# Patient Record
Sex: Female | Born: 1945 | Race: White | Hispanic: No | Smoking: Former smoker
Health system: Southern US, Community
[De-identification: ages and names within clinical notes are randomized; demographics above are authoritative.]

## PROBLEM LIST (undated history)

## (undated) DIAGNOSIS — D179 Benign lipomatous neoplasm, unspecified: Secondary | ICD-10-CM

## (undated) DIAGNOSIS — E669 Obesity, unspecified: Secondary | ICD-10-CM

## (undated) DIAGNOSIS — M25561 Pain in right knee: Secondary | ICD-10-CM

## (undated) DIAGNOSIS — K5792 Diverticulitis of intestine, part unspecified, without perforation or abscess without bleeding: Secondary | ICD-10-CM

## (undated) DIAGNOSIS — I10 Essential (primary) hypertension: Secondary | ICD-10-CM

## (undated) DIAGNOSIS — M25562 Pain in left knee: Secondary | ICD-10-CM

## (undated) DIAGNOSIS — C541 Malignant neoplasm of endometrium: Secondary | ICD-10-CM

## (undated) HISTORY — PX: CATARACT EXTRACTION: SUR2

## (undated) HISTORY — DX: Pain in right knee: M25.561

## (undated) HISTORY — DX: Obesity, unspecified: E66.9

## (undated) HISTORY — PX: TOTAL ABDOMINAL HYSTERECTOMY: SHX209

## (undated) HISTORY — DX: Essential (primary) hypertension: I10

## (undated) HISTORY — DX: Malignant neoplasm of endometrium: C54.1

## (undated) HISTORY — DX: Benign lipomatous neoplasm, unspecified: D17.9

## (undated) HISTORY — DX: Pain in left knee: M25.562

---

## 1998-06-13 ENCOUNTER — Emergency Department (HOSPITAL_COMMUNITY): Admission: EM | Admit: 1998-06-13 | Discharge: 1998-06-13 | Payer: Self-pay | Admitting: Emergency Medicine

## 1998-06-18 ENCOUNTER — Ambulatory Visit (HOSPITAL_COMMUNITY): Admission: RE | Admit: 1998-06-18 | Discharge: 1998-06-18 | Payer: Self-pay | Admitting: Cardiology

## 2003-12-04 ENCOUNTER — Emergency Department (HOSPITAL_COMMUNITY): Admission: EM | Admit: 2003-12-04 | Discharge: 2003-12-04 | Payer: Self-pay | Admitting: Emergency Medicine

## 2006-04-30 ENCOUNTER — Ambulatory Visit: Payer: Self-pay | Admitting: Family Medicine

## 2006-12-21 ENCOUNTER — Ambulatory Visit: Payer: Self-pay | Admitting: Family Medicine

## 2008-02-18 ENCOUNTER — Ambulatory Visit: Payer: Self-pay | Admitting: Family Medicine

## 2008-02-19 ENCOUNTER — Ambulatory Visit: Payer: Self-pay | Admitting: Family Medicine

## 2008-12-29 ENCOUNTER — Ambulatory Visit: Payer: Self-pay | Admitting: Family Medicine

## 2010-01-17 ENCOUNTER — Ambulatory Visit: Payer: Self-pay | Admitting: Family Medicine

## 2011-12-05 DIAGNOSIS — I10 Essential (primary) hypertension: Secondary | ICD-10-CM | POA: Diagnosis not present

## 2011-12-11 DIAGNOSIS — Z23 Encounter for immunization: Secondary | ICD-10-CM | POA: Diagnosis not present

## 2012-10-27 DIAGNOSIS — Z23 Encounter for immunization: Secondary | ICD-10-CM | POA: Diagnosis not present

## 2012-11-27 HISTORY — PX: CHOLECYSTECTOMY: SHX55

## 2012-11-29 DIAGNOSIS — I10 Essential (primary) hypertension: Secondary | ICD-10-CM | POA: Diagnosis not present

## 2012-11-29 DIAGNOSIS — F411 Generalized anxiety disorder: Secondary | ICD-10-CM | POA: Diagnosis not present

## 2012-11-29 DIAGNOSIS — Z1331 Encounter for screening for depression: Secondary | ICD-10-CM | POA: Diagnosis not present

## 2013-05-27 DIAGNOSIS — K8010 Calculus of gallbladder with chronic cholecystitis without obstruction: Secondary | ICD-10-CM | POA: Diagnosis not present

## 2013-05-27 DIAGNOSIS — R1011 Right upper quadrant pain: Secondary | ICD-10-CM | POA: Diagnosis not present

## 2013-05-27 DIAGNOSIS — Z79899 Other long term (current) drug therapy: Secondary | ICD-10-CM | POA: Diagnosis not present

## 2013-05-27 DIAGNOSIS — R112 Nausea with vomiting, unspecified: Secondary | ICD-10-CM | POA: Diagnosis not present

## 2013-05-27 DIAGNOSIS — K828 Other specified diseases of gallbladder: Secondary | ICD-10-CM | POA: Diagnosis not present

## 2013-05-27 DIAGNOSIS — K8000 Calculus of gallbladder with acute cholecystitis without obstruction: Secondary | ICD-10-CM | POA: Diagnosis not present

## 2013-05-27 DIAGNOSIS — E669 Obesity, unspecified: Secondary | ICD-10-CM | POA: Diagnosis not present

## 2013-05-27 DIAGNOSIS — I10 Essential (primary) hypertension: Secondary | ICD-10-CM | POA: Diagnosis not present

## 2013-05-27 DIAGNOSIS — R109 Unspecified abdominal pain: Secondary | ICD-10-CM | POA: Diagnosis not present

## 2013-05-27 DIAGNOSIS — K8020 Calculus of gallbladder without cholecystitis without obstruction: Secondary | ICD-10-CM | POA: Diagnosis not present

## 2013-08-12 DIAGNOSIS — F411 Generalized anxiety disorder: Secondary | ICD-10-CM | POA: Diagnosis not present

## 2013-08-12 DIAGNOSIS — Z23 Encounter for immunization: Secondary | ICD-10-CM | POA: Diagnosis not present

## 2013-08-12 DIAGNOSIS — I10 Essential (primary) hypertension: Secondary | ICD-10-CM | POA: Diagnosis not present

## 2013-08-12 DIAGNOSIS — Z1211 Encounter for screening for malignant neoplasm of colon: Secondary | ICD-10-CM | POA: Diagnosis not present

## 2014-01-16 DIAGNOSIS — Z1211 Encounter for screening for malignant neoplasm of colon: Secondary | ICD-10-CM | POA: Diagnosis not present

## 2014-05-19 DIAGNOSIS — H2589 Other age-related cataract: Secondary | ICD-10-CM | POA: Diagnosis not present

## 2014-06-15 DIAGNOSIS — H269 Unspecified cataract: Secondary | ICD-10-CM | POA: Diagnosis not present

## 2014-06-15 DIAGNOSIS — H2510 Age-related nuclear cataract, unspecified eye: Secondary | ICD-10-CM | POA: Diagnosis not present

## 2014-06-15 DIAGNOSIS — H2589 Other age-related cataract: Secondary | ICD-10-CM | POA: Diagnosis not present

## 2014-07-07 ENCOUNTER — Other Ambulatory Visit (HOSPITAL_COMMUNITY)
Admission: RE | Admit: 2014-07-07 | Discharge: 2014-07-07 | Disposition: A | Discharge status: Home / Self Care | Payer: Medicare Other | Source: Ambulatory Visit | Attending: Obstetrics & Gynecology | Admitting: Obstetrics & Gynecology

## 2014-07-07 ENCOUNTER — Other Ambulatory Visit: Payer: Self-pay | Admitting: Obstetrics & Gynecology

## 2014-07-07 DIAGNOSIS — Z124 Encounter for screening for malignant neoplasm of cervix: Secondary | ICD-10-CM | POA: Diagnosis not present

## 2014-07-07 DIAGNOSIS — Z1151 Encounter for screening for human papillomavirus (HPV): Secondary | ICD-10-CM | POA: Insufficient documentation

## 2014-07-07 DIAGNOSIS — N950 Postmenopausal bleeding: Secondary | ICD-10-CM | POA: Diagnosis not present

## 2014-07-07 DIAGNOSIS — N840 Polyp of corpus uteri: Secondary | ICD-10-CM | POA: Diagnosis not present

## 2014-07-07 DIAGNOSIS — N859 Noninflammatory disorder of uterus, unspecified: Secondary | ICD-10-CM | POA: Diagnosis not present

## 2014-07-08 LAB — CYTOLOGY - PAP

## 2014-07-09 ENCOUNTER — Other Ambulatory Visit: Payer: Self-pay | Admitting: Obstetrics & Gynecology

## 2014-07-09 DIAGNOSIS — N950 Postmenopausal bleeding: Secondary | ICD-10-CM

## 2014-07-10 ENCOUNTER — Encounter (HOSPITAL_COMMUNITY): Payer: Self-pay | Admitting: *Deleted

## 2014-07-10 ENCOUNTER — Inpatient Hospital Stay (HOSPITAL_COMMUNITY)
Admission: AD | Admit: 2014-07-10 | Discharge: 2014-07-10 | Disposition: A | Discharge status: Home / Self Care | Payer: Medicare Other | Source: Ambulatory Visit | Attending: Obstetrics and Gynecology | Admitting: Obstetrics and Gynecology

## 2014-07-10 DIAGNOSIS — C549 Malignant neoplasm of corpus uteri, unspecified: Secondary | ICD-10-CM | POA: Diagnosis not present

## 2014-07-10 DIAGNOSIS — N8502 Endometrial intraepithelial neoplasia [EIN]: Secondary | ICD-10-CM | POA: Diagnosis not present

## 2014-07-10 DIAGNOSIS — N898 Other specified noninflammatory disorders of vagina: Secondary | ICD-10-CM | POA: Diagnosis not present

## 2014-07-10 DIAGNOSIS — N950 Postmenopausal bleeding: Secondary | ICD-10-CM

## 2014-07-10 LAB — CBC
HCT: 37.8 % (ref 36.0–46.0)
Hemoglobin: 12.7 g/dL (ref 12.0–15.0)
MCH: 29.7 pg (ref 26.0–34.0)
MCHC: 33.6 g/dL (ref 30.0–36.0)
MCV: 88.5 fL (ref 78.0–100.0)
Platelets: 278 10*3/uL (ref 150–400)
RBC: 4.27 MIL/uL (ref 3.87–5.11)
RDW: 14 % (ref 11.5–15.5)
WBC: 11.2 10*3/uL — AB (ref 4.0–10.5)

## 2014-07-10 LAB — SAMPLE TO BLOOD BANK

## 2014-07-10 MED ORDER — POLYSACCHARIDE IRON COMPLEX 150 MG PO CAPS: 150.0000 mg | ORAL_CAPSULE | Freq: Every day | ORAL | Status: DC

## 2014-07-10 NOTE — MAU Provider Note (Signed)
History     CSN: [id number]  Arrival date and time: 07/10/14 1347   None     Chief Complaint  Patient presents with  . Vaginal Bleeding   HPI Mariah Wright is 68 y.o.  Presents with heavy vaginal bleeding passing clot and tissue X 5 days. She passed a large clot this am--less bleeding since. She is a patient of Dr. Annie Main.  She was seen Monday in the office, had endometrial biopsy which she reports as negative.  She called office and they directed her to MAU.  She states she is dizzy, tired, occasional tinging in her finger, panic attacks.  She denies abdominal pain but pelvic pressure when passing clots.    No past medical history on file.  No past surgical history on file.  No family history on file.  History  Substance Use Topics  . Smoking status: Not on file  . Smokeless tobacco: Not on file  . Alcohol Use: Not on file    Allergies: Allergies not on file  No prescriptions prior to admission    Review of Systems  Constitutional: Negative for fever and chills.  Gastrointestinal: Negative for abdominal pain.  Genitourinary: Negative for dysuria, urgency, frequency and hematuria.       + vaginal bleeding  Passing clots and tissue   Physical Exam   Blood pressure 143/71, pulse 65, temperature 98.5 F (36.9 C), temperature source Oral, resp. rate 20, height 5' 3.39" (1.61 m), weight 182 lb 6 oz (82.725 kg).  Physical Exam  Constitutional: She is oriented to person, place, and time. She appears well-developed and well-nourished. No distress.  GI: Soft. There is no tenderness. There is no rebound and no guarding.  Genitourinary: There is no rash, tenderness or lesion on the right labia. There is no rash, tenderness or lesion on the left labia. Uterus is not enlarged and not tender. Cervix exhibits no motion tenderness and no friability. Right adnexum displays no mass, no tenderness and no fullness. Left adnexum displays no mass, no tenderness and no fullness. No  tenderness or bleeding around the vagina. No vaginal discharge found.  1 small clot in the os.  Neg for active bleeding.  Neurological: She is alert and oriented to person, place, and time.  Skin: Skin is warm and dry.  Psychiatric: She has a normal mood and affect. Her behavior is normal.    Results for orders placed during the hospital encounter of 07/10/14 (from the past 24 hour(s))  CBC     Status: Abnormal   Collection Time    07/10/14  3:37 PM      Result Value Ref Range   WBC 11.2 (*) 4.0 - 10.5 K/uL   RBC 4.27  3.87 - 5.11 MIL/uL   Hemoglobin 12.7  12.0 - 15.0 g/dL   HCT 37.8  36.0 - 46.0 %   MCV 88.5  78.0 - 100.0 fL   MCH 29.7  26.0 - 34.0 pg   MCHC 33.6  30.0 - 36.0 g/dL   RDW 14.0  11.5 - 15.5 %   Platelets 278  150 - 400 K/uL  SAMPLE TO BLOOD BANK     Status: None   Collection Time    07/10/14  3:40 PM      Result Value Ref Range   Blood Bank Specimen SAMPLE AVAILABLE FOR TESTING     Sample Expiration 07/13/2014     MAU Course  Procedures  MDM  Dr. Simona Huh in to see patient.  Care turned over to Dr. Simona Huh Assessment and Plan  A:  Vaginal bleeding after Endometrial Bx 4 days ago  P:  Dr. Simona Huh spoke with patient      Follow up per her instructions.  KEY,EVE M 07/10/2014, 5:02 PM

## 2014-07-10 NOTE — MAU Note (Signed)
Thought she had had her period for 2 wks.  Had a biopsy on Monday, was not malignant.   Started passing large- hand sized  Clots; one clot this morning was more tissue like.  Feeling tired; getting short of breath at times.

## 2014-07-10 NOTE — MAU Note (Signed)
Urine in lab 

## 2014-07-10 NOTE — Discharge Instructions (Signed)

## 2014-07-11 NOTE — MAU Provider Note (Signed)
Pt seen in short stay. HPI, PE, labs reviewed with NP and pt.  Agree to discharge home with bleeding precautions. Continue Provera and start iron daily.  Pt given OTC rx.    Pt also brought specimen that she passed at home.  She thought it was definitely more than a blood clot.  It appears similar to a decidual cast.  Blood clots also present.  Sent to pathology.  Pelvic ultrasound ordered for the upcoming week.  Plan discussed with pt and all questions answered.   Pt to call office next week to schedule f/u appointment.

## 2014-07-13 DIAGNOSIS — H2589 Other age-related cataract: Secondary | ICD-10-CM | POA: Diagnosis not present

## 2014-07-13 DIAGNOSIS — H269 Unspecified cataract: Secondary | ICD-10-CM | POA: Diagnosis not present

## 2014-07-13 DIAGNOSIS — H2510 Age-related nuclear cataract, unspecified eye: Secondary | ICD-10-CM | POA: Diagnosis not present

## 2014-07-14 ENCOUNTER — Ambulatory Visit
Admission: RE | Admit: 2014-07-14 | Discharge: 2014-07-14 | Disposition: A | Discharge status: Home / Self Care | Payer: Medicare Other | Source: Ambulatory Visit | Attending: Obstetrics & Gynecology | Admitting: Obstetrics & Gynecology

## 2014-07-14 DIAGNOSIS — N950 Postmenopausal bleeding: Secondary | ICD-10-CM

## 2014-07-14 DIAGNOSIS — R9389 Abnormal findings on diagnostic imaging of other specified body structures: Secondary | ICD-10-CM | POA: Diagnosis not present

## 2014-07-28 DIAGNOSIS — R87619 Unspecified abnormal cytological findings in specimens from cervix uteri: Secondary | ICD-10-CM | POA: Diagnosis not present

## 2014-07-29 DIAGNOSIS — N840 Polyp of corpus uteri: Secondary | ICD-10-CM | POA: Diagnosis not present

## 2014-07-30 DIAGNOSIS — C549 Malignant neoplasm of corpus uteri, unspecified: Secondary | ICD-10-CM | POA: Diagnosis not present

## 2014-07-30 DIAGNOSIS — Z801 Family history of malignant neoplasm of trachea, bronchus and lung: Secondary | ICD-10-CM | POA: Diagnosis not present

## 2014-07-30 DIAGNOSIS — Z79899 Other long term (current) drug therapy: Secondary | ICD-10-CM | POA: Diagnosis not present

## 2014-07-30 DIAGNOSIS — I10 Essential (primary) hypertension: Secondary | ICD-10-CM | POA: Diagnosis not present

## 2014-07-30 DIAGNOSIS — Z9079 Acquired absence of other genital organ(s): Secondary | ICD-10-CM | POA: Diagnosis not present

## 2014-07-30 DIAGNOSIS — N950 Postmenopausal bleeding: Secondary | ICD-10-CM | POA: Diagnosis not present

## 2014-07-30 DIAGNOSIS — Z7982 Long term (current) use of aspirin: Secondary | ICD-10-CM | POA: Diagnosis not present

## 2014-08-17 DIAGNOSIS — H02839 Dermatochalasis of unspecified eye, unspecified eyelid: Secondary | ICD-10-CM | POA: Diagnosis not present

## 2014-08-25 DIAGNOSIS — Z01818 Encounter for other preprocedural examination: Secondary | ICD-10-CM | POA: Diagnosis not present

## 2014-08-26 DIAGNOSIS — E782 Mixed hyperlipidemia: Secondary | ICD-10-CM | POA: Diagnosis not present

## 2014-08-26 DIAGNOSIS — Z1331 Encounter for screening for depression: Secondary | ICD-10-CM | POA: Diagnosis not present

## 2014-08-26 DIAGNOSIS — I10 Essential (primary) hypertension: Secondary | ICD-10-CM | POA: Diagnosis not present

## 2014-08-26 DIAGNOSIS — Z780 Asymptomatic menopausal state: Secondary | ICD-10-CM | POA: Diagnosis not present

## 2014-08-26 DIAGNOSIS — F411 Generalized anxiety disorder: Secondary | ICD-10-CM | POA: Diagnosis not present

## 2014-08-26 DIAGNOSIS — Z0000 Encounter for general adult medical examination without abnormal findings: Secondary | ICD-10-CM | POA: Diagnosis not present

## 2014-08-28 DIAGNOSIS — N950 Postmenopausal bleeding: Secondary | ICD-10-CM | POA: Diagnosis not present

## 2014-08-28 DIAGNOSIS — Z9049 Acquired absence of other specified parts of digestive tract: Secondary | ICD-10-CM | POA: Diagnosis not present

## 2014-08-28 DIAGNOSIS — Z6830 Body mass index (BMI) 30.0-30.9, adult: Secondary | ICD-10-CM | POA: Diagnosis not present

## 2014-08-28 DIAGNOSIS — Z79899 Other long term (current) drug therapy: Secondary | ICD-10-CM | POA: Diagnosis not present

## 2014-08-28 DIAGNOSIS — Z7982 Long term (current) use of aspirin: Secondary | ICD-10-CM | POA: Diagnosis not present

## 2014-08-28 DIAGNOSIS — E669 Obesity, unspecified: Secondary | ICD-10-CM | POA: Diagnosis not present

## 2014-08-28 DIAGNOSIS — I10 Essential (primary) hypertension: Secondary | ICD-10-CM | POA: Diagnosis not present

## 2014-08-28 DIAGNOSIS — Z87891 Personal history of nicotine dependence: Secondary | ICD-10-CM | POA: Diagnosis not present

## 2014-08-28 DIAGNOSIS — C541 Malignant neoplasm of endometrium: Secondary | ICD-10-CM | POA: Diagnosis not present

## 2014-08-29 DIAGNOSIS — Z6830 Body mass index (BMI) 30.0-30.9, adult: Secondary | ICD-10-CM | POA: Diagnosis not present

## 2014-08-29 DIAGNOSIS — E669 Obesity, unspecified: Secondary | ICD-10-CM | POA: Diagnosis not present

## 2014-08-29 DIAGNOSIS — Z7982 Long term (current) use of aspirin: Secondary | ICD-10-CM | POA: Diagnosis not present

## 2014-08-29 DIAGNOSIS — I10 Essential (primary) hypertension: Secondary | ICD-10-CM | POA: Diagnosis not present

## 2014-08-29 DIAGNOSIS — N950 Postmenopausal bleeding: Secondary | ICD-10-CM | POA: Diagnosis not present

## 2014-08-29 DIAGNOSIS — C541 Malignant neoplasm of endometrium: Secondary | ICD-10-CM | POA: Diagnosis not present

## 2014-09-01 DIAGNOSIS — C541 Malignant neoplasm of endometrium: Secondary | ICD-10-CM | POA: Diagnosis not present

## 2014-09-17 DIAGNOSIS — Z23 Encounter for immunization: Secondary | ICD-10-CM | POA: Diagnosis not present

## 2014-09-28 ENCOUNTER — Encounter (HOSPITAL_COMMUNITY): Payer: Self-pay | Admitting: *Deleted

## 2014-09-28 DIAGNOSIS — H02834 Dermatochalasis of left upper eyelid: Secondary | ICD-10-CM | POA: Diagnosis not present

## 2014-09-28 DIAGNOSIS — H02401 Unspecified ptosis of right eyelid: Secondary | ICD-10-CM | POA: Diagnosis not present

## 2014-09-28 DIAGNOSIS — H02831 Dermatochalasis of right upper eyelid: Secondary | ICD-10-CM | POA: Diagnosis not present

## 2015-02-24 ENCOUNTER — Other Ambulatory Visit: Payer: Self-pay | Admitting: Internal Medicine

## 2015-02-24 DIAGNOSIS — I10 Essential (primary) hypertension: Secondary | ICD-10-CM | POA: Diagnosis not present

## 2015-02-24 DIAGNOSIS — Z1231 Encounter for screening mammogram for malignant neoplasm of breast: Secondary | ICD-10-CM

## 2015-02-24 DIAGNOSIS — Z780 Asymptomatic menopausal state: Secondary | ICD-10-CM | POA: Diagnosis not present

## 2015-02-24 DIAGNOSIS — Z23 Encounter for immunization: Secondary | ICD-10-CM | POA: Diagnosis not present

## 2015-02-24 DIAGNOSIS — D171 Benign lipomatous neoplasm of skin and subcutaneous tissue of trunk: Secondary | ICD-10-CM | POA: Diagnosis not present

## 2015-03-04 ENCOUNTER — Ambulatory Visit: Payer: Medicare Other

## 2015-04-08 DIAGNOSIS — C541 Malignant neoplasm of endometrium: Secondary | ICD-10-CM | POA: Diagnosis not present

## 2015-04-08 DIAGNOSIS — Z6831 Body mass index (BMI) 31.0-31.9, adult: Secondary | ICD-10-CM | POA: Diagnosis not present

## 2015-04-08 DIAGNOSIS — Z8542 Personal history of malignant neoplasm of other parts of uterus: Secondary | ICD-10-CM | POA: Diagnosis not present

## 2015-04-08 DIAGNOSIS — E669 Obesity, unspecified: Secondary | ICD-10-CM | POA: Diagnosis not present

## 2015-08-30 DIAGNOSIS — N8502 Endometrial intraepithelial neoplasia [EIN]: Secondary | ICD-10-CM | POA: Diagnosis not present

## 2015-08-30 DIAGNOSIS — M1990 Unspecified osteoarthritis, unspecified site: Secondary | ICD-10-CM | POA: Diagnosis not present

## 2015-08-30 DIAGNOSIS — Z6831 Body mass index (BMI) 31.0-31.9, adult: Secondary | ICD-10-CM | POA: Diagnosis not present

## 2015-08-30 DIAGNOSIS — Z1389 Encounter for screening for other disorder: Secondary | ICD-10-CM | POA: Diagnosis not present

## 2015-08-30 DIAGNOSIS — D175 Benign lipomatous neoplasm of intra-abdominal organs: Secondary | ICD-10-CM | POA: Diagnosis not present

## 2015-08-30 DIAGNOSIS — E669 Obesity, unspecified: Secondary | ICD-10-CM | POA: Diagnosis not present

## 2015-08-30 DIAGNOSIS — D179 Benign lipomatous neoplasm, unspecified: Secondary | ICD-10-CM | POA: Diagnosis not present

## 2015-08-30 DIAGNOSIS — I10 Essential (primary) hypertension: Secondary | ICD-10-CM | POA: Diagnosis not present

## 2015-08-30 DIAGNOSIS — E782 Mixed hyperlipidemia: Secondary | ICD-10-CM | POA: Diagnosis not present

## 2015-08-30 DIAGNOSIS — Z23 Encounter for immunization: Secondary | ICD-10-CM | POA: Diagnosis not present

## 2015-10-14 DIAGNOSIS — E669 Obesity, unspecified: Secondary | ICD-10-CM | POA: Diagnosis not present

## 2015-10-14 DIAGNOSIS — I10 Essential (primary) hypertension: Secondary | ICD-10-CM | POA: Diagnosis not present

## 2015-10-14 DIAGNOSIS — Z9079 Acquired absence of other genital organ(s): Secondary | ICD-10-CM | POA: Diagnosis not present

## 2015-10-14 DIAGNOSIS — Z90710 Acquired absence of both cervix and uterus: Secondary | ICD-10-CM | POA: Diagnosis not present

## 2015-10-14 DIAGNOSIS — Z8542 Personal history of malignant neoplasm of other parts of uterus: Secondary | ICD-10-CM | POA: Diagnosis not present

## 2015-10-14 DIAGNOSIS — Z802 Family history of malignant neoplasm of other respiratory and intrathoracic organs: Secondary | ICD-10-CM | POA: Diagnosis not present

## 2015-10-14 DIAGNOSIS — Z90722 Acquired absence of ovaries, bilateral: Secondary | ICD-10-CM | POA: Diagnosis not present

## 2015-10-14 DIAGNOSIS — Z90721 Acquired absence of ovaries, unilateral: Secondary | ICD-10-CM | POA: Diagnosis not present

## 2015-10-14 DIAGNOSIS — Z7982 Long term (current) use of aspirin: Secondary | ICD-10-CM | POA: Diagnosis not present

## 2015-10-14 DIAGNOSIS — Z08 Encounter for follow-up examination after completed treatment for malignant neoplasm: Secondary | ICD-10-CM | POA: Diagnosis not present

## 2015-10-14 DIAGNOSIS — Z6831 Body mass index (BMI) 31.0-31.9, adult: Secondary | ICD-10-CM | POA: Diagnosis not present

## 2016-02-22 DIAGNOSIS — E782 Mixed hyperlipidemia: Secondary | ICD-10-CM | POA: Diagnosis not present

## 2016-02-22 DIAGNOSIS — I10 Essential (primary) hypertension: Secondary | ICD-10-CM | POA: Diagnosis not present

## 2016-02-22 DIAGNOSIS — R8299 Other abnormal findings in urine: Secondary | ICD-10-CM | POA: Diagnosis not present

## 2016-02-29 DIAGNOSIS — I10 Essential (primary) hypertension: Secondary | ICD-10-CM | POA: Diagnosis not present

## 2016-02-29 DIAGNOSIS — D179 Benign lipomatous neoplasm, unspecified: Secondary | ICD-10-CM | POA: Diagnosis not present

## 2016-02-29 DIAGNOSIS — Z0000 Encounter for general adult medical examination without abnormal findings: Secondary | ICD-10-CM | POA: Diagnosis not present

## 2016-02-29 DIAGNOSIS — E668 Other obesity: Secondary | ICD-10-CM | POA: Diagnosis not present

## 2016-02-29 DIAGNOSIS — Z6831 Body mass index (BMI) 31.0-31.9, adult: Secondary | ICD-10-CM | POA: Diagnosis not present

## 2016-02-29 DIAGNOSIS — M1990 Unspecified osteoarthritis, unspecified site: Secondary | ICD-10-CM | POA: Diagnosis not present

## 2016-02-29 DIAGNOSIS — Z1389 Encounter for screening for other disorder: Secondary | ICD-10-CM | POA: Diagnosis not present

## 2016-02-29 DIAGNOSIS — E782 Mixed hyperlipidemia: Secondary | ICD-10-CM | POA: Diagnosis not present

## 2016-02-29 DIAGNOSIS — R739 Hyperglycemia, unspecified: Secondary | ICD-10-CM | POA: Diagnosis not present

## 2016-02-29 DIAGNOSIS — D175 Benign lipomatous neoplasm of intra-abdominal organs: Secondary | ICD-10-CM | POA: Diagnosis not present

## 2016-03-02 DIAGNOSIS — Z1212 Encounter for screening for malignant neoplasm of rectum: Secondary | ICD-10-CM | POA: Diagnosis not present

## 2016-03-30 DIAGNOSIS — Z6831 Body mass index (BMI) 31.0-31.9, adult: Secondary | ICD-10-CM | POA: Diagnosis not present

## 2016-03-30 DIAGNOSIS — Z90710 Acquired absence of both cervix and uterus: Secondary | ICD-10-CM | POA: Diagnosis not present

## 2016-03-30 DIAGNOSIS — Z90722 Acquired absence of ovaries, bilateral: Secondary | ICD-10-CM | POA: Diagnosis not present

## 2016-03-30 DIAGNOSIS — Z8542 Personal history of malignant neoplasm of other parts of uterus: Secondary | ICD-10-CM | POA: Diagnosis not present

## 2016-03-30 DIAGNOSIS — Z9079 Acquired absence of other genital organ(s): Secondary | ICD-10-CM | POA: Diagnosis not present

## 2016-03-30 DIAGNOSIS — C541 Malignant neoplasm of endometrium: Secondary | ICD-10-CM | POA: Diagnosis not present

## 2016-03-30 DIAGNOSIS — Z08 Encounter for follow-up examination after completed treatment for malignant neoplasm: Secondary | ICD-10-CM | POA: Diagnosis not present

## 2016-03-30 DIAGNOSIS — E669 Obesity, unspecified: Secondary | ICD-10-CM | POA: Diagnosis not present

## 2016-09-18 DIAGNOSIS — M1990 Unspecified osteoarthritis, unspecified site: Secondary | ICD-10-CM | POA: Diagnosis not present

## 2016-09-18 DIAGNOSIS — Z9189 Other specified personal risk factors, not elsewhere classified: Secondary | ICD-10-CM | POA: Diagnosis not present

## 2016-09-18 DIAGNOSIS — R7309 Other abnormal glucose: Secondary | ICD-10-CM | POA: Diagnosis not present

## 2016-09-18 DIAGNOSIS — E782 Mixed hyperlipidemia: Secondary | ICD-10-CM | POA: Diagnosis not present

## 2016-09-18 DIAGNOSIS — Z23 Encounter for immunization: Secondary | ICD-10-CM | POA: Diagnosis not present

## 2016-09-18 DIAGNOSIS — I10 Essential (primary) hypertension: Secondary | ICD-10-CM | POA: Diagnosis not present

## 2016-09-18 DIAGNOSIS — G4709 Other insomnia: Secondary | ICD-10-CM | POA: Diagnosis not present

## 2016-09-18 DIAGNOSIS — E668 Other obesity: Secondary | ICD-10-CM | POA: Diagnosis not present

## 2016-09-18 DIAGNOSIS — Z6831 Body mass index (BMI) 31.0-31.9, adult: Secondary | ICD-10-CM | POA: Diagnosis not present

## 2016-10-09 ENCOUNTER — Encounter: Payer: Self-pay | Admitting: Neurology

## 2016-10-09 ENCOUNTER — Ambulatory Visit (INDEPENDENT_AMBULATORY_CARE_PROVIDER_SITE_OTHER): Payer: Medicare Other | Admitting: Neurology

## 2016-10-09 VITALS — BP 122/68 | HR 68 | Resp 20 | Ht 64.0 in | Wt 184.0 lb

## 2016-10-09 DIAGNOSIS — R0683 Snoring: Secondary | ICD-10-CM | POA: Diagnosis not present

## 2016-10-09 DIAGNOSIS — E6609 Other obesity due to excess calories: Secondary | ICD-10-CM | POA: Diagnosis not present

## 2016-10-09 DIAGNOSIS — R682 Dry mouth, unspecified: Secondary | ICD-10-CM

## 2016-10-09 DIAGNOSIS — R351 Nocturia: Secondary | ICD-10-CM | POA: Diagnosis not present

## 2016-10-09 DIAGNOSIS — R065 Mouth breathing: Secondary | ICD-10-CM

## 2016-10-09 NOTE — Patient Instructions (Signed)

## 2016-10-09 NOTE — Progress Notes (Signed)
SLEEP MEDICINE CLINIC   Provider:  Larey Seat, M D  Referring Provider: Shon Baton, MD Primary Care Physician:  No primary care provider on file.  Chief Complaint  Patient presents with  . New Patient (Initial Visit)    snoring, never had sleep study    HPI:  Mariah Wright is a 70 y.o. female , seen here as a referral  from Dr. Virgina Jock for a sleep evaluation, consultation.  Mariah Wright is a Caucasian right-handed female patient of Dr. Keane Police, presenting for a possible sleep study. Dr. Virgina Jock stated that the patient has several risk factors a larger than average neck, and elevated body mass index and that she had stated that she has nocturia is going to the bathroom to times at night times more, that she wakes up often spontaneously - not always clear why .That she doesn't sleep quite as well as she used to is  Her chief complaint. . Her former spouse wore earplugs, because of her loud snoring.   Past medical history is positive for insomnia, obesity, osteoarthritis, hyperglycemia, hyperlipidemia, essential hypertension, history of endometrial cancer surgery in 2015 she is status post cataract surgery bilaterally, cholecystectomy, tonsillectomy in childhood.  Sleep habits are as follows: She goes to bed around 9 PM which means that she retreats to the bedroom. She crochets in bed. She does not watch TV and it is actually hard for her to stay awake as she lives alone and has nobody for company. Her dog used to wake her up because of its own bathroom needs, but the dog expired in July.  Nights she will wake up around midnight or in the early morning hours. Most frequently she wakes at 3.15- on the dot.  She has trouble to stay asleep not to go to sleep. She will not wake up at night more than twice. Most times she has no problems reinitiating sleep. Her morning rise time is around 7:30 AM, she set an alarm if she has an appointment but usually is up spontaneously but that time. She feels  refreshed in the morning - she denies any headaches palpitations or diaphoresis.  Sleep medical history and family sleep history:  "tonsills out at age 39" Social history:  Divorced, 2 sons. Non drinker, non caffeine user, non smoker.  She does house and yard work, but no exercise.   Review of Systems: Out of a complete 14 system review, the patient complains of only the following symptoms, and all other reviewed systems are negative.   Epworth score 2 , Fatigue severity score 14  ,  Geriatric depression score 0/15    Social History   Social History  . Marital status: Legally Separated    Spouse name: N/A  . Number of children: N/A  . Years of education: N/A   Occupational History  . Not on file.   Social History Main Topics  . Smoking status: Former Research scientist (life sciences)  . Smokeless tobacco: Not on file  . Alcohol use No  . Drug use: No  . Sexual activity: Not on file   Other Topics Concern  . Not on file   Social History Narrative  . No narrative on file    Family History  Problem Relation Age of Onset  . Adopted: Yes  . Family history unknown: Yes    Past Medical History:  Diagnosis Date  . Bilateral knee pain   . Endometrial cancer ([place])   . Hypertension   . Lipoma    RUQ  .  Obesity     Past Surgical History:  Procedure Laterality Date  . CATARACT EXTRACTION    . CHOLECYSTECTOMY  2014  . TOTAL ABDOMINAL HYSTERECTOMY      Current Outpatient Prescriptions  Medication Sig Dispense Refill  . aspirin 81 MG tablet Take 40.5 mg by mouth daily.    Marland Kitchen lisinopril-hydrochlorothiazide (PRINZIDE,ZESTORETIC) 10-12.5 MG per tablet Take 1 tablet by mouth daily.    . Multiple Vitamins-Minerals (MULTI FOR HER PO) Take 1 tablet by mouth daily.     No current facility-administered medications for this visit.     Allergies as of 10/09/2016  . (No Known Allergies)    Vitals: BP 122/68   Pulse 68   Resp 20   Ht 5\' 4"  (1.626 m)   Wt 184 lb (83.5 kg)   BMI 31.58 kg/m  Last  Weight:  Wt Readings from Last 1 Encounters:  10/09/16 184 lb (83.5 kg)   [id number] mass index is 31.58 kg/m.     Last Height:   Ht Readings from Last 1 Encounters:  10/09/16 5\' 4"  (1.626 m)    Physical exam:  General: The patient is awake, alert and appears not in acute distress. The patient is well groomed. Head: Normocephalic, atraumatic. Neck is supple. Mallampati 2,  Just lost 11 pounds.  neck circumference:16. Nasal airflow restricted , TMJ is evident . Retrognathia is not seen.  Crowded lower jaw.  Cardiovascular:  Regular rate and rhythm , without  murmurs or carotid bruit, and without distended neck veins. Respiratory: Lungs are clear to auscultation. Skin:  Without evidence of edema, or rash Trunk: BMI is 32 Neurologic exam : The patient is awake and alert, oriented to place and time.   Memory subjective described as intact.    Attention span & concentration ability appears normal.  Speech is fluent,  without dysarthria, dysphonia or aphasia.  Mood and affect are appropriate.  Cranial nerves: Pupils are equal and briskly reactive to light.  Extraocular movements  in vertical and horizontal planes intact and without nystagmus. Visual fields by finger perimetry are intact. Hearing to finger rub intact.   Facial sensation intact to fine touch.  Facial motor strength is symmetric and tongue and uvula move midline. Shoulder shrug was symmetrical.   Motor exam:   Normal tone, muscle bulk and symmetric strength in all extremities.  Sensory:  Fine touch, pinprick and vibration were tested in all extremities.   Coordination:  Finger-to-nose maneuver normal without evidence of ataxia, dysmetria or tremor.  Gait and station: Patient walks without assistive device.Stance is stable and normal.   Deep tendon reflexes: in the upper and lower extremities are symmetric and intact. Babinski maneuver response is downgoing.  The patient was advised of the nature of the diagnosed sleep  disorder , the treatment options and risks for general a health and wellness arising from not treating the condition.  I spent more than 45 minutes of face to face time with the patient. Greater than 50% of time was spent in counseling and coordination of care. We have discussed the diagnosis and differential and I answered the patient's questions.     Assessment:  After physical and neurologic examination, review of laboratory studies,  Personal review of imaging studies, reports of other /same  Imaging studies ,  Results of polysomnography/ neurophysiology testing and pre-existing records as far as provided in visit., my assessment is   1)  Mariah Wright presents after a weight loss of about 11 pounds which has already  made her feel better, more alert and less fatigued.   She complains of loud snoring which has been witnessed by others,  but she is not sure that she ever had apnea.  She has never been told that she stops breathing. She had a tonsillectomy in childhood neck circumference and BMI are still borderline, and this would justify a screening test for apnea. I cannot however establish the diagnosis today. I would like for her to undergo a home sleep test but a Medicare patient only an in lab test is covered.   I will invite her for a night at [place] at [facility].   Plan:  Treatment plan and additional workup :  PSG with evaluation for snoring and apnea,  continue weight loss,  She established a bedtime between 9 PM and 6 AM.      Asencion Partridge Tequlia Gonsalves MD  10/09/2016   CC: Shon Baton, [address] [place], [place] [id number]

## 2016-10-12 ENCOUNTER — Ambulatory Visit (INDEPENDENT_AMBULATORY_CARE_PROVIDER_SITE_OTHER): Payer: Medicare Other | Admitting: Neurology

## 2016-10-12 DIAGNOSIS — G478 Other sleep disorders: Secondary | ICD-10-CM

## 2016-10-12 DIAGNOSIS — R065 Mouth breathing: Secondary | ICD-10-CM

## 2016-10-12 DIAGNOSIS — R682 Dry mouth, unspecified: Secondary | ICD-10-CM

## 2016-10-12 DIAGNOSIS — R0683 Snoring: Secondary | ICD-10-CM

## 2016-10-12 DIAGNOSIS — R351 Nocturia: Secondary | ICD-10-CM

## 2016-10-12 DIAGNOSIS — E6609 Other obesity due to excess calories: Secondary | ICD-10-CM

## 2016-10-26 ENCOUNTER — Telehealth: Payer: Self-pay

## 2016-10-26 DIAGNOSIS — Z9889 Other specified postprocedural states: Secondary | ICD-10-CM | POA: Diagnosis not present

## 2016-10-26 DIAGNOSIS — Z90722 Acquired absence of ovaries, bilateral: Secondary | ICD-10-CM | POA: Diagnosis not present

## 2016-10-26 DIAGNOSIS — Z9049 Acquired absence of other specified parts of digestive tract: Secondary | ICD-10-CM | POA: Diagnosis not present

## 2016-10-26 DIAGNOSIS — Z8542 Personal history of malignant neoplasm of other parts of uterus: Secondary | ICD-10-CM | POA: Diagnosis not present

## 2016-10-26 DIAGNOSIS — Z90710 Acquired absence of both cervix and uterus: Secondary | ICD-10-CM | POA: Diagnosis not present

## 2016-10-26 DIAGNOSIS — C541 Malignant neoplasm of endometrium: Secondary | ICD-10-CM | POA: Diagnosis not present

## 2016-10-26 DIAGNOSIS — Z9079 Acquired absence of other genital organ(s): Secondary | ICD-10-CM | POA: Diagnosis not present

## 2016-10-26 DIAGNOSIS — Z08 Encounter for follow-up examination after completed treatment for malignant neoplasm: Secondary | ICD-10-CM | POA: Diagnosis not present

## 2016-10-26 DIAGNOSIS — Z6830 Body mass index (BMI) 30.0-30.9, adult: Secondary | ICD-10-CM | POA: Diagnosis not present

## 2016-10-26 DIAGNOSIS — E669 Obesity, unspecified: Secondary | ICD-10-CM | POA: Diagnosis not present

## 2016-10-26 NOTE — Telephone Encounter (Signed)
-----   Message from Larey Seat, MD sent at 10/26/2016 10:22 AM EST ----- No apnea , just snoring. Severe problems to sleep - perhaps due to environment.  I recommend a mouth guard for snoring treatment. Insomnia therapy with psychology should be offered. CD

## 2016-10-26 NOTE — Procedures (Signed)
PATIENT'S NAME:  Mariah Wright, Mariah Wright DOB:      October 06, 1946      MR#:    [id number]     DATE OF RECORDING: 10/12/2016 REFERRING M.D.:  Shon Baton MD Study Performed:   Baseline Polysomnogram HISTORY:  Mariah Wright is a 70 year old female with a sleep concern of  large neck size, high grade Mallampati, weight gain, nocturia and witnessed loud snoring. She has difficulty sleeping through the night.   History of HTN, endometrial cancer, osteoarthritis, obesity.   The patient endorsed the Epworth Sleepiness Scale at 2 points.  The patient's weight 184 pounds with a height of 64 (inches), resulting in a BMI of 31.2 kg/m2.The patient's neck circumference measured 16 inches. Epworth2, FSS 14 , GDS 0/15   CURRENT MEDICATIONS: Aspirin, Lisinopril   PROCEDURE:  This is a multichannel digital polysomnogram utilizing the Somnostar 11.2 system.  Electrodes and sensors were applied and monitored per AASM Specifications.   EEG, EOG, Chin and Limb EMG, were sampled at 200 Hz.  ECG, Snore and Nasal Pressure, Thermal Airflow, Respiratory Effort, CPAP Flow and Pressure, Oximetry was sampled at 50 Hz. Digital video and audio were recorded.      BASELINE STUDY : Lights Out was at 22:46 and Lights On at 05:03.  Total recording time (TRT) was 377 minutes, with a total sleep time (TST) of 133.5 minutes.   The patient's sleep latency was 37.5 minutes.  REM latency was 287 minutes.  The sleep efficiency was 35.4 %.     SLEEP ARCHITECTURE: WASO (Wake after sleep onset) was 162 minutes.  There were 11.5 minutes in Stage N1, 78.5 minutes Stage N2, 35 minutes Stage N3 and 8.5 minutes in Stage REM.  The percentage of Stage N1 was 8.6%, Stage N2 was 58.8%, Stage N3 was 26.2% and Stage R (REM sleep) was 6.4%.   RESPIRATORY ANALYSIS:  There was 1 respiratory event:  1 hypopnea . 0 respiratory event related arousals (RERAs). The total APNEA/HYPOPNEA INDEX (AHI) was 0.4/hour and the total RESPIRATORY DISTURBANCE INDEX was .4 /hour.  1 event  occurred in REM sleep and 0 events in NREM. The REM AHI was 7.1 /hour, versus a non-REM AHI of 0.0 The patient spent 29.5 minutes of total sleep time in the supine position and 104 minutes in non-supine. The supine AHI was 0.0 versus a non-supine AHI of 0.6.  OXYGEN SATURATION & C02:  The Wake baseline 02 saturation was 94%, with the lowest being 89%. Time spent below 89% saturation equaled 0 minutes.  PERIODIC LIMB MOVEMENTS:   The patient had a Periodic Limb Movement (PLM) index of 65.2 and the PLM Arousal index was 2.2/hour.  IMPRESSION: Insomnia, unexplained by physiological data. Snoring is moderate, unaccompanied by apnea.    RECOMMENDATIONS: Snoring treatment can include use of a dental device or continued weight loss.  Insomnia was not related to snoring, nocturia. PLMs were frequently seen, but few let to arousals.   1.  Avoid caffeine-containing beverages and chocolate. Implement good sleep hygiene. Set a bed time and rise time. Avoid daytime naps.  2. Dedicated sleep psychology referral is recommended , since insomnia is of clinical concern.   3. A follow up appointment can be scheduled in the Sleep Clinic at [facility]. The referring provider will be notified of the results. 4.  Please call us at [phone] if you or your physician have additional questions.       I certify that I have reviewed the  entire raw data recording prior to the issuance of this report in accordance with the Standards of Accreditation of the American Academy of Sleep Medicine (AASM)      Larey Seat, MD  10-26-2016 Diplomat, American Board of Psychiatry and Neurology  Diplomat, American Board of [place] Director, [place] [facility] [employer]

## 2016-10-26 NOTE — Telephone Encounter (Signed)
Copy of sleep study results sent to Dr. Virgina Jock, referring physician.

## 2016-10-26 NOTE — Telephone Encounter (Signed)
I spoke to pt and advised her that her sleep study did not reveal apnea, just snoring. Pt had severe problems going to sleep. Dr. Brett Fairy recommends a mouth guard for snoring and a psychologist for insomnia treatment. Pt said she will call us back if she wants either the dental device or a psychology referral. Pt also declined a follow up with Dr. Brett Fairy at this time. Pt verbalized understanding of results. Pt had no questions at this time but was encouraged to call back if questions arise.

## 2017-02-27 DIAGNOSIS — R7309 Other abnormal glucose: Secondary | ICD-10-CM | POA: Diagnosis not present

## 2017-02-27 DIAGNOSIS — I10 Essential (primary) hypertension: Secondary | ICD-10-CM | POA: Diagnosis not present

## 2017-02-27 DIAGNOSIS — E782 Mixed hyperlipidemia: Secondary | ICD-10-CM | POA: Diagnosis not present

## 2017-03-06 DIAGNOSIS — R7309 Other abnormal glucose: Secondary | ICD-10-CM | POA: Diagnosis not present

## 2017-03-06 DIAGNOSIS — M1990 Unspecified osteoarthritis, unspecified site: Secondary | ICD-10-CM | POA: Diagnosis not present

## 2017-03-06 DIAGNOSIS — Z6832 Body mass index (BMI) 32.0-32.9, adult: Secondary | ICD-10-CM | POA: Diagnosis not present

## 2017-03-06 DIAGNOSIS — E782 Mixed hyperlipidemia: Secondary | ICD-10-CM | POA: Diagnosis not present

## 2017-03-06 DIAGNOSIS — G4709 Other insomnia: Secondary | ICD-10-CM | POA: Diagnosis not present

## 2017-03-06 DIAGNOSIS — I10 Essential (primary) hypertension: Secondary | ICD-10-CM | POA: Diagnosis not present

## 2017-03-06 DIAGNOSIS — Z0000 Encounter for general adult medical examination without abnormal findings: Secondary | ICD-10-CM | POA: Diagnosis not present

## 2017-03-06 DIAGNOSIS — E668 Other obesity: Secondary | ICD-10-CM | POA: Diagnosis not present

## 2017-03-06 DIAGNOSIS — Z9189 Other specified personal risk factors, not elsewhere classified: Secondary | ICD-10-CM | POA: Diagnosis not present

## 2017-03-06 DIAGNOSIS — D179 Benign lipomatous neoplasm, unspecified: Secondary | ICD-10-CM | POA: Diagnosis not present

## 2017-03-06 DIAGNOSIS — Z1389 Encounter for screening for other disorder: Secondary | ICD-10-CM | POA: Diagnosis not present

## 2017-03-15 DIAGNOSIS — Z1212 Encounter for screening for malignant neoplasm of rectum: Secondary | ICD-10-CM | POA: Diagnosis not present

## 2017-09-03 DIAGNOSIS — G4709 Other insomnia: Secondary | ICD-10-CM | POA: Diagnosis not present

## 2017-09-03 DIAGNOSIS — Z6832 Body mass index (BMI) 32.0-32.9, adult: Secondary | ICD-10-CM | POA: Diagnosis not present

## 2017-09-03 DIAGNOSIS — Z23 Encounter for immunization: Secondary | ICD-10-CM | POA: Diagnosis not present

## 2017-09-03 DIAGNOSIS — R7309 Other abnormal glucose: Secondary | ICD-10-CM | POA: Diagnosis not present

## 2017-09-03 DIAGNOSIS — E668 Other obesity: Secondary | ICD-10-CM | POA: Diagnosis not present

## 2017-09-03 DIAGNOSIS — M1990 Unspecified osteoarthritis, unspecified site: Secondary | ICD-10-CM | POA: Diagnosis not present

## 2017-09-03 DIAGNOSIS — I10 Essential (primary) hypertension: Secondary | ICD-10-CM | POA: Diagnosis not present

## 2017-10-31 DIAGNOSIS — C541 Malignant neoplasm of endometrium: Secondary | ICD-10-CM | POA: Diagnosis not present

## 2017-10-31 DIAGNOSIS — Z8542 Personal history of malignant neoplasm of other parts of uterus: Secondary | ICD-10-CM | POA: Diagnosis not present

## 2017-10-31 DIAGNOSIS — Z08 Encounter for follow-up examination after completed treatment for malignant neoplasm: Secondary | ICD-10-CM | POA: Diagnosis not present

## 2017-10-31 DIAGNOSIS — Z90721 Acquired absence of ovaries, unilateral: Secondary | ICD-10-CM | POA: Diagnosis not present

## 2017-10-31 DIAGNOSIS — Z9079 Acquired absence of other genital organ(s): Secondary | ICD-10-CM | POA: Diagnosis not present

## 2017-10-31 DIAGNOSIS — Z801 Family history of malignant neoplasm of trachea, bronchus and lung: Secondary | ICD-10-CM | POA: Diagnosis not present

## 2017-10-31 DIAGNOSIS — Z90710 Acquired absence of both cervix and uterus: Secondary | ICD-10-CM | POA: Diagnosis not present

## 2018-03-01 DIAGNOSIS — E782 Mixed hyperlipidemia: Secondary | ICD-10-CM | POA: Diagnosis not present

## 2018-03-01 DIAGNOSIS — I10 Essential (primary) hypertension: Secondary | ICD-10-CM | POA: Diagnosis not present

## 2018-03-01 DIAGNOSIS — R7309 Other abnormal glucose: Secondary | ICD-10-CM | POA: Diagnosis not present

## 2018-03-01 DIAGNOSIS — R82998 Other abnormal findings in urine: Secondary | ICD-10-CM | POA: Diagnosis not present

## 2018-03-08 DIAGNOSIS — E668 Other obesity: Secondary | ICD-10-CM | POA: Diagnosis not present

## 2018-03-08 DIAGNOSIS — Z6833 Body mass index (BMI) 33.0-33.9, adult: Secondary | ICD-10-CM | POA: Diagnosis not present

## 2018-03-08 DIAGNOSIS — D179 Benign lipomatous neoplasm, unspecified: Secondary | ICD-10-CM | POA: Diagnosis not present

## 2018-03-08 DIAGNOSIS — N8502 Endometrial intraepithelial neoplasia [EIN]: Secondary | ICD-10-CM | POA: Diagnosis not present

## 2018-03-08 DIAGNOSIS — Z1389 Encounter for screening for other disorder: Secondary | ICD-10-CM | POA: Diagnosis not present

## 2018-03-08 DIAGNOSIS — E7800 Pure hypercholesterolemia, unspecified: Secondary | ICD-10-CM | POA: Diagnosis not present

## 2018-03-08 DIAGNOSIS — G4700 Insomnia, unspecified: Secondary | ICD-10-CM | POA: Diagnosis not present

## 2018-03-08 DIAGNOSIS — M1990 Unspecified osteoarthritis, unspecified site: Secondary | ICD-10-CM | POA: Diagnosis not present

## 2018-03-08 DIAGNOSIS — Z0000 Encounter for general adult medical examination without abnormal findings: Secondary | ICD-10-CM | POA: Diagnosis not present

## 2018-03-08 DIAGNOSIS — I10 Essential (primary) hypertension: Secondary | ICD-10-CM | POA: Diagnosis not present

## 2018-03-08 DIAGNOSIS — R7309 Other abnormal glucose: Secondary | ICD-10-CM | POA: Diagnosis not present

## 2018-03-11 DIAGNOSIS — Z1212 Encounter for screening for malignant neoplasm of rectum: Secondary | ICD-10-CM | POA: Diagnosis not present

## 2018-09-09 DIAGNOSIS — G4709 Other insomnia: Secondary | ICD-10-CM | POA: Diagnosis not present

## 2018-09-09 DIAGNOSIS — Z6832 Body mass index (BMI) 32.0-32.9, adult: Secondary | ICD-10-CM | POA: Diagnosis not present

## 2018-09-09 DIAGNOSIS — E668 Other obesity: Secondary | ICD-10-CM | POA: Diagnosis not present

## 2018-09-09 DIAGNOSIS — I10 Essential (primary) hypertension: Secondary | ICD-10-CM | POA: Diagnosis not present

## 2018-09-09 DIAGNOSIS — M1990 Unspecified osteoarthritis, unspecified site: Secondary | ICD-10-CM | POA: Diagnosis not present

## 2018-09-09 DIAGNOSIS — E782 Mixed hyperlipidemia: Secondary | ICD-10-CM | POA: Diagnosis not present

## 2018-09-09 DIAGNOSIS — R7309 Other abnormal glucose: Secondary | ICD-10-CM | POA: Diagnosis not present

## 2018-09-09 DIAGNOSIS — Z1389 Encounter for screening for other disorder: Secondary | ICD-10-CM | POA: Diagnosis not present

## 2018-09-09 DIAGNOSIS — Z23 Encounter for immunization: Secondary | ICD-10-CM | POA: Diagnosis not present

## 2018-10-31 DIAGNOSIS — C541 Malignant neoplasm of endometrium: Secondary | ICD-10-CM | POA: Diagnosis not present

## 2019-03-18 DIAGNOSIS — E782 Mixed hyperlipidemia: Secondary | ICD-10-CM | POA: Diagnosis not present

## 2019-03-18 DIAGNOSIS — R739 Hyperglycemia, unspecified: Secondary | ICD-10-CM | POA: Diagnosis not present

## 2019-03-18 DIAGNOSIS — I10 Essential (primary) hypertension: Secondary | ICD-10-CM | POA: Diagnosis not present

## 2019-03-19 ENCOUNTER — Emergency Department (HOSPITAL_COMMUNITY): Payer: Medicare Other

## 2019-03-19 ENCOUNTER — Inpatient Hospital Stay (HOSPITAL_COMMUNITY)
Admission: EM | Admit: 2019-03-19 | Discharge: 2019-03-22 | DRG: 872 | Disposition: A | Discharge status: Home / Self Care | Payer: Medicare Other | Attending: Internal Medicine | Admitting: Internal Medicine

## 2019-03-19 ENCOUNTER — Other Ambulatory Visit: Payer: Self-pay

## 2019-03-19 DIAGNOSIS — Z7982 Long term (current) use of aspirin: Secondary | ICD-10-CM | POA: Diagnosis not present

## 2019-03-19 DIAGNOSIS — G4733 Obstructive sleep apnea (adult) (pediatric): Secondary | ICD-10-CM | POA: Diagnosis present

## 2019-03-19 DIAGNOSIS — E876 Hypokalemia: Secondary | ICD-10-CM | POA: Diagnosis present

## 2019-03-19 DIAGNOSIS — Z9079 Acquired absence of other genital organ(s): Secondary | ICD-10-CM

## 2019-03-19 DIAGNOSIS — R531 Weakness: Secondary | ICD-10-CM | POA: Diagnosis not present

## 2019-03-19 DIAGNOSIS — I10 Essential (primary) hypertension: Secondary | ICD-10-CM | POA: Diagnosis not present

## 2019-03-19 DIAGNOSIS — K5720 Diverticulitis of large intestine with perforation and abscess without bleeding: Secondary | ICD-10-CM | POA: Diagnosis not present

## 2019-03-19 DIAGNOSIS — Z209 Contact with and (suspected) exposure to unspecified communicable disease: Secondary | ICD-10-CM | POA: Diagnosis not present

## 2019-03-19 DIAGNOSIS — C541 Malignant neoplasm of endometrium: Secondary | ICD-10-CM

## 2019-03-19 DIAGNOSIS — M25561 Pain in right knee: Secondary | ICD-10-CM | POA: Diagnosis present

## 2019-03-19 DIAGNOSIS — R5383 Other fatigue: Secondary | ICD-10-CM | POA: Diagnosis not present

## 2019-03-19 DIAGNOSIS — E669 Obesity, unspecified: Secondary | ICD-10-CM | POA: Diagnosis present

## 2019-03-19 DIAGNOSIS — Z90722 Acquired absence of ovaries, bilateral: Secondary | ICD-10-CM

## 2019-03-19 DIAGNOSIS — E559 Vitamin D deficiency, unspecified: Secondary | ICD-10-CM | POA: Diagnosis present

## 2019-03-19 DIAGNOSIS — R188 Other ascites: Secondary | ICD-10-CM | POA: Diagnosis present

## 2019-03-19 DIAGNOSIS — K5780 Diverticulitis of intestine, part unspecified, with perforation and abscess without bleeding: Secondary | ICD-10-CM | POA: Diagnosis present

## 2019-03-19 DIAGNOSIS — Z8542 Personal history of malignant neoplasm of other parts of uterus: Secondary | ICD-10-CM

## 2019-03-19 DIAGNOSIS — Z9049 Acquired absence of other specified parts of digestive tract: Secondary | ICD-10-CM

## 2019-03-19 DIAGNOSIS — Z90710 Acquired absence of both cervix and uterus: Secondary | ICD-10-CM | POA: Diagnosis not present

## 2019-03-19 DIAGNOSIS — A419 Sepsis, unspecified organism: Principal | ICD-10-CM | POA: Diagnosis present

## 2019-03-19 DIAGNOSIS — D179 Benign lipomatous neoplasm, unspecified: Secondary | ICD-10-CM | POA: Diagnosis present

## 2019-03-19 DIAGNOSIS — Z79899 Other long term (current) drug therapy: Secondary | ICD-10-CM

## 2019-03-19 DIAGNOSIS — K651 Peritoneal abscess: Secondary | ICD-10-CM

## 2019-03-19 DIAGNOSIS — R5381 Other malaise: Secondary | ICD-10-CM | POA: Diagnosis not present

## 2019-03-19 DIAGNOSIS — Z87891 Personal history of nicotine dependence: Secondary | ICD-10-CM

## 2019-03-19 DIAGNOSIS — K5792 Diverticulitis of intestine, part unspecified, without perforation or abscess without bleeding: Secondary | ICD-10-CM

## 2019-03-19 DIAGNOSIS — Z6830 Body mass index (BMI) 30.0-30.9, adult: Secondary | ICD-10-CM | POA: Diagnosis not present

## 2019-03-19 DIAGNOSIS — M25562 Pain in left knee: Secondary | ICD-10-CM | POA: Diagnosis present

## 2019-03-19 DIAGNOSIS — R509 Fever, unspecified: Secondary | ICD-10-CM | POA: Diagnosis not present

## 2019-03-19 DIAGNOSIS — R319 Hematuria, unspecified: Secondary | ICD-10-CM | POA: Diagnosis not present

## 2019-03-19 HISTORY — DX: Diverticulitis of intestine, part unspecified, without perforation or abscess without bleeding: K57.92

## 2019-03-19 LAB — COMPREHENSIVE METABOLIC PANEL
ALT: 46 U/L — ABNORMAL HIGH (ref 0–44)
ALT: 43 U/L (ref 0–44)
AST: 38 U/L (ref 15–41)
AST: 26 U/L (ref 15–41)
Albumin: 3.4 g/dL — ABNORMAL LOW (ref 3.5–5.0)
Albumin: 3.2 g/dL — ABNORMAL LOW (ref 3.5–5.0)
Alkaline Phosphatase: 60 U/L (ref 38–126)
Alkaline Phosphatase: 59 U/L (ref 38–126)
Anion gap: 13 (ref 5–15)
Anion gap: 10 (ref 5–15)
BUN: 12 mg/dL (ref 8–23)
BUN: 10 mg/dL (ref 8–23)
CO2: 24 mmol/L (ref 22–32)
CO2: 27 mmol/L (ref 22–32)
Calcium: 9.2 mg/dL (ref 8.9–10.3)
Calcium: 9.2 mg/dL (ref 8.9–10.3)
Chloride: 97 mmol/L — ABNORMAL LOW (ref 98–111)
Chloride: 97 mmol/L — ABNORMAL LOW (ref 98–111)
Creatinine, Ser: 0.85 mg/dL (ref 0.44–1.00)
Creatinine, Ser: 1.01 mg/dL — ABNORMAL HIGH (ref 0.44–1.00)
GFR calc Af Amer: >60 mL/min (ref >60)
GFR calc Af Amer: >60 mL/min (ref >60)
GFR calc non Af Amer: >60 mL/min (ref >60)
GFR calc non Af Amer: 55 mL/min — ABNORMAL LOW (ref >60)
Glucose, Bld: 157 mg/dL — ABNORMAL HIGH (ref 70–99)
Glucose, Bld: 157 mg/dL — ABNORMAL HIGH (ref 70–99)
Potassium: 3.4 mmol/L — ABNORMAL LOW (ref 3.5–5.1)
Potassium: 3.3 mmol/L — ABNORMAL LOW (ref 3.5–5.1)
Sodium: 134 mmol/L — ABNORMAL LOW (ref 135–145)
Sodium: 134 mmol/L — ABNORMAL LOW (ref 135–145)
Total Bilirubin: 1.9 mg/dL — ABNORMAL HIGH (ref 0.3–1.2)
Total Bilirubin: 2.1 mg/dL — ABNORMAL HIGH (ref 0.3–1.2)
Total Protein: 6.5 g/dL (ref 6.5–8.1)
Total Protein: 6.8 g/dL (ref 6.5–8.1)

## 2019-03-19 LAB — CBC
HCT: 37.2 % (ref 36.0–46.0)
Hemoglobin: 12.3 g/dL (ref 12.0–15.0)
MCH: 29.1 pg (ref 26.0–34.0)
MCHC: 33.1 g/dL (ref 30.0–36.0)
MCV: 88.2 fL (ref 80.0–100.0)
Platelets: 241 10*3/uL (ref 150–400)
RBC: 4.22 MIL/uL (ref 3.87–5.11)
RDW: 14.1 % (ref 11.5–15.5)
WBC: 21.6 10*3/uL — ABNORMAL HIGH (ref 4.0–10.5)
nRBC: 0 % (ref 0.0–0.2)

## 2019-03-19 LAB — LACTIC ACID, PLASMA: Lactic Acid, Venous: 1.7 mmol/L (ref 0.5–1.9)

## 2019-03-19 LAB — URINALYSIS, ROUTINE W REFLEX MICROSCOPIC
Bilirubin Urine: NEGATIVE
Glucose, UA: NEGATIVE mg/dL
Ketones, ur: NEGATIVE mg/dL
Leukocytes,Ua: NEGATIVE
Nitrite: NEGATIVE
Protein, ur: 30 mg/dL — AB
Specific Gravity, Urine: 1.025 (ref 1.005–1.030)
pH: 5 (ref 5.0–8.0)

## 2019-03-19 LAB — CBC WITH DIFFERENTIAL/PLATELET
Abs Immature Granulocytes: 0 10*3/uL (ref 0.00–0.07)
Basophils Absolute: 0.2 10*3/uL — ABNORMAL HIGH (ref 0.0–0.1)
Basophils Relative: 1 %
Eosinophils Absolute: 0 10*3/uL (ref 0.0–0.5)
Eosinophils Relative: 0 %
HCT: 37.9 % (ref 36.0–46.0)
Hemoglobin: 12.5 g/dL (ref 12.0–15.0)
Lymphocytes Relative: 5 %
Lymphs Abs: 1.2 10*3/uL (ref 0.7–4.0)
MCH: 29.3 pg (ref 26.0–34.0)
MCHC: 33 g/dL (ref 30.0–36.0)
MCV: 89 fL (ref 80.0–100.0)
Monocytes Absolute: 0.9 10*3/uL (ref 0.1–1.0)
Monocytes Relative: 4 %
Neutro Abs: 20.7 10*3/uL — ABNORMAL HIGH (ref 1.7–7.7)
Neutrophils Relative %: 90 %
Platelets: 246 10*3/uL (ref 150–400)
RBC: 4.26 MIL/uL (ref 3.87–5.11)
RDW: 14.3 % (ref 11.5–15.5)
WBC: 23 10*3/uL — ABNORMAL HIGH (ref 4.0–10.5)
nRBC: 0 % (ref 0.0–0.2)
nRBC: 0 /100 WBC

## 2019-03-19 LAB — MAGNESIUM: Magnesium: 1.8 mg/dL (ref 1.7–2.4)

## 2019-03-19 MED ORDER — LISINOPRIL-HYDROCHLOROTHIAZIDE 20-25 MG PO TABS: 1.0000 | ORAL_TABLET | Freq: Every day | ORAL | Status: DC

## 2019-03-19 MED ORDER — ACETAMINOPHEN 325 MG PO TABS
650.0000 mg | ORAL_TABLET | Freq: Four times a day (QID) | ORAL | Status: DC | PRN
  Administered 2019-03-20 – 2019-03-21 (×3): 650 mg via ORAL
  Filled 2019-03-19 (×3): qty 2

## 2019-03-19 MED ORDER — SENNOSIDES-DOCUSATE SODIUM 8.6-50 MG PO TABS: 1.0000 | ORAL_TABLET | Freq: Every evening | ORAL | Status: DC | PRN

## 2019-03-19 MED ORDER — IOHEXOL 300 MG/ML  SOLN
100.0000 mL | Freq: Once | INTRAMUSCULAR | Status: AC | PRN
  Administered 2019-03-19: 100 mL via INTRAVENOUS

## 2019-03-19 MED ORDER — VITAMIN C 500 MG PO TABS
1000.0000 mg | ORAL_TABLET | Freq: Every day | ORAL | Status: DC
  Administered 2019-03-21 – 2019-03-22 (×2): 1000 mg via ORAL
  Filled 2019-03-19 (×2): qty 2

## 2019-03-19 MED ORDER — HYDROCHLOROTHIAZIDE 25 MG PO TABS
25.0000 mg | ORAL_TABLET | Freq: Every day | ORAL | Status: DC
  Administered 2019-03-20 – 2019-03-22 (×3): 25 mg via ORAL
  Filled 2019-03-19 (×3): qty 1

## 2019-03-19 MED ORDER — INSULIN ASPART 100 UNIT/ML ~~LOC~~ SOLN
0.0000 [IU] | Freq: Three times a day (TID) | SUBCUTANEOUS | Status: DC
  Administered 2019-03-20 – 2019-03-21 (×2): 1 [IU] via SUBCUTANEOUS

## 2019-03-19 MED ORDER — PIPERACILLIN-TAZOBACTAM 3.375 G IVPB 30 MIN
3.3750 g | Freq: Once | INTRAVENOUS | Status: AC
  Administered 2019-03-19: 3.375 g via INTRAVENOUS
  Filled 2019-03-19: qty 50

## 2019-03-19 MED ORDER — LISINOPRIL 20 MG PO TABS
20.0000 mg | ORAL_TABLET | Freq: Every day | ORAL | Status: DC
  Administered 2019-03-20 – 2019-03-22 (×3): 20 mg via ORAL
  Filled 2019-03-19 (×3): qty 1

## 2019-03-19 MED ORDER — HEPARIN SODIUM (PORCINE) 5000 UNIT/ML IJ SOLN
5000.0000 [IU] | Freq: Three times a day (TID) | INTRAMUSCULAR | Status: DC
  Administered 2019-03-19 – 2019-03-20 (×2): 5000 [IU] via SUBCUTANEOUS
  Filled 2019-03-19 (×2): qty 1

## 2019-03-19 MED ORDER — BISACODYL 5 MG PO TBEC: 5.0000 mg | DELAYED_RELEASE_TABLET | Freq: Every day | ORAL | Status: DC | PRN

## 2019-03-19 MED ORDER — ASPIRIN EC 81 MG PO TBEC
81.0000 mg | DELAYED_RELEASE_TABLET | Freq: Every day | ORAL | Status: DC
  Administered 2019-03-21 – 2019-03-22 (×2): 81 mg via ORAL
  Filled 2019-03-19 (×2): qty 1

## 2019-03-19 MED ORDER — SODIUM CHLORIDE 0.9 % IV SOLN
INTRAVENOUS | Status: DC
  Administered 2019-03-19 – 2019-03-21 (×3): via INTRAVENOUS

## 2019-03-19 MED ORDER — ACETAMINOPHEN 650 MG RE SUPP: 650.0000 mg | Freq: Four times a day (QID) | RECTAL | Status: DC | PRN

## 2019-03-19 NOTE — ED Provider Notes (Signed)
[facility] EMERGENCY DEPARTMENT Provider Note   CSN: [id number] Arrival date & time: 03/19/19  1534    History   Chief Complaint Chief Complaint  Patient presents with  . Fatigue    HPI Mariah Wright is a 73 y.o. female.     HPI Patient presents with concern of weakness, fatigue. Onset was 8 AM yesterday, 32 hours ago, relatively sudden. Since that time symptoms been persistent, without focal weakness, but of diffuse lack of energy, inability to perform previously normal ADL. She is unaware of fever, denies nausea, vomiting, early satiety, diarrhea. She also denies chest pain, dyspnea, cough. However, with worsening fatigue and weakness that she went to her primary care office. She was found to have fever, leukocytosis, and sent here for evaluation. Patient notes that only memorable change in recent health condition is darkening of her urine, though without dysuria.  Past Medical History:  Diagnosis Date  . Bilateral knee pain   . Endometrial cancer ([place])   . Hypertension   . Lipoma    RUQ  . Obesity     Patient Active Problem List   Diagnosis Date Noted  . Snoring 10/09/2016  . Mouth dryness 10/09/2016  . Nocturia 10/09/2016  . Class 2 obesity due to excess calories without serious comorbidity in adult 10/09/2016  . Chronic mouth breathing 10/09/2016    Past Surgical History:  Procedure Laterality Date  . CATARACT EXTRACTION    . CHOLECYSTECTOMY  2014  . TOTAL ABDOMINAL HYSTERECTOMY       OB History    Gravida  3   Para      Term      Preterm      AB      Living  2     SAB      TAB      Ectopic      Multiple      Live Births               Home Medications    Prior to Admission medications   Medication Sig Start Date End Date Taking? Authorizing Provider  aspirin 81 MG tablet Take 81 mg by mouth daily.    Yes [provider]  Cholecalciferol (VITAMIN D-3 PO) Take 1 capsule by mouth daily with  breakfast.   Yes [provider]  Docusate Calcium (STOOL SOFTENER PO) Take 1 capsule by mouth daily with breakfast.   Yes [provider]  Glucosamine HCl (GLUCOSAMINE PO) Take 2 tablets by mouth daily.   Yes [provider]  lisinopril-hydrochlorothiazide (ZESTORETIC) 20-25 MG tablet Take 1 tablet by mouth daily.   Yes [provider]  vitamin C (ASCORBIC ACID) 500 MG tablet Take 1,000 mg by mouth daily.   Yes [provider]  Multiple Vitamins-Minerals (MULTI FOR HER PO) Take 1 tablet by mouth daily.    [provider]    Family History Family History  Adopted: Yes  Family history unknown: Yes    Social History Social History   Tobacco Use  . Smoking status: Former Smoker  Substance Use Topics  . Alcohol use: No  . Drug use: No     Allergies   Patient has no known allergies.   Review of Systems Review of Systems  Constitutional:       Per HPI, otherwise negative  HENT:       Per HPI, otherwise negative  Respiratory:       Per HPI, otherwise negative  Cardiovascular:       Per HPI, otherwise negative  Gastrointestinal: Negative for vomiting.  Endocrine:       Negative aside from HPI  Genitourinary:       Neg aside from HPI   Musculoskeletal:       Per HPI, otherwise negative  Skin: Negative.   Neurological: Positive for weakness. Negative for syncope.     Physical Exam Updated Vital Signs BP 134/67   Pulse 80   Temp 99.7 F (37.6 C) (Oral)   Resp 19   Ht 5\' 4"  (1.626 m)   Wt 81.6 kg   SpO2 93%   BMI 30.90 kg/m   Physical Exam Vitals signs and nursing note reviewed.  Constitutional:      General: She is not in acute distress.    Appearance: She is well-developed.  HENT:     Head: Normocephalic and atraumatic.  Eyes:     Conjunctiva/sclera: Conjunctivae normal.  Cardiovascular:     Rate and Rhythm: Normal rate and regular rhythm.  Pulmonary:     Effort: Pulmonary effort is normal. No  respiratory distress.     Breath sounds: Normal breath sounds. No stridor. No wheezing or rales.  Abdominal:     General: There is no distension.     Tenderness: There is no abdominal tenderness. There is no guarding.  Skin:    General: Skin is warm and dry.  Neurological:     Mental Status: She is alert and oriented to person, place, and time.     Cranial Nerves: No cranial nerve deficit.      ED Treatments / Results  Labs (all labs ordered are listed, but only abnormal results are displayed) Labs Reviewed  COMPREHENSIVE METABOLIC PANEL - Abnormal; Notable for the following components:      Result Value   Sodium 134 (*)    Potassium 3.4 (*)    Chloride 97 (*)    Glucose, Bld 157 (*)    Albumin 3.4 (*)    ALT 46 (*)    Total Bilirubin 1.9 (*)    All other components within normal limits  CBC WITH DIFFERENTIAL/PLATELET - Abnormal; Notable for the following components:   WBC 23.0 (*)    Neutro Abs 20.7 (*)    Basophils Absolute 0.2 (*)    All other components within normal limits  URINALYSIS, ROUTINE W REFLEX MICROSCOPIC - Abnormal; Notable for the following components:   APPearance TURBID (*)    Hgb urine dipstick MODERATE (*)    Protein, ur 30 (*)    Bacteria, UA MANY (*)    All other components within normal limits  LACTIC ACID, PLASMA    EKG EKG Interpretation  Date/Time:  Wednesday March 19 2019 15:41:25 EDT Ventricular Rate:  98 PR Interval:    QRS Duration: 93 QT Interval:  392 QTC Calculation: 501 R Axis:   69 Text Interpretation:  Sinus rhythm Low voltage, precordial leads Borderline T wave abnormalities Artifact Baseline wander Abnormal ekg Confirmed by Carmin Muskrat [phone]) on 03/19/2019 3:48:47 PM   Radiology Ct Abdomen Pelvis W Contrast  Result Date: 03/19/2019 CLINICAL DATA:  Febrile, not feeling well EXAM: CT ABDOMEN AND PELVIS WITH CONTRAST TECHNIQUE: Multidetector CT imaging of the abdomen and pelvis was performed using the standard protocol  following bolus administration of intravenous contrast. CONTRAST:  188mL OMNIPAQUE IOHEXOL 300 MG/ML  SOLN COMPARISON:  None. FINDINGS: Lower chest: Lung bases show no acute consolidation or effusion. Normal heart size Hepatobiliary: 2.3 cm  cyst within the anterior left hepatic lobe. Status post cholecystectomy. Pancreas: Unremarkable. No pancreatic ductal dilatation or surrounding inflammatory changes. Spleen: Normal in size without focal abnormality. Adrenals/Urinary Tract: Adrenal glands are unremarkable. Kidneys are normal, without renal calculi, focal lesion, or hydronephrosis. Bladder is slightly thick walled with mild surrounding inflammatory changes. Stomach/Bowel: The stomach is unremarkable. No dilated small bowel. Mild segmental thickening involving the sigmoid colon. Vascular/Lymphatic: Nonaneurysmal aorta. Mild aortic atherosclerosis. No significant adenopathy. Reproductive: Status post hysterectomy. Other: No free air. No significant free fluid. Generalized inflammation within the pelvis. Rim enhancing fluid collection within the left pelvis measuring 6.3 x 5.8 cm, abutting the slightly thickened sigmoid colon. Musculoskeletal: No acute or suspicious abnormality IMPRESSION: 1. 6.3 x 5.8 cm mildly rim enhancing fluid collection within the left pelvis, suspect for infected or inflammatory fluid collection. This abuts the sigmoid colon which appears mildly thickened. Findings could be secondary to contained perforation and subsequent abscess formation, possibly due to diverticulitis although not much diverticular changes are seen in the region. No definitive findings for a colon mass. Alternatively, could consider inflammatory or infected adnexal mass. 2. Generalized edema and inflammation within the pelvis. 3. Slightly thick-walled appearance of the bladder with adjacent edema, which may reflect cystitis or reactive changes from the left pelvic rim enhancing collection. Electronically Signed   By: Donavan Foil M.D.   On: 03/19/2019 20:03   Dg Chest Port 1 View  Result Date: 03/19/2019 CLINICAL DATA:  73 year old female with feeling weak EXAM: PORTABLE CHEST 1 VIEW COMPARISON:  None. FINDINGS: The heart size and mediastinal contours are within normal limits. Both lungs are clear. The visualized skeletal structures are unremarkable. IMPRESSION: Negative for acute cardiopulmonary disease Electronically Signed   By: Corrie Mckusick D.O.   On: 03/19/2019 16:01    Procedures Procedures (including critical care time)  Medications Ordered in ED Medications  piperacillin-tazobactam (ZOSYN) IVPB 3.375 g (3.375 g Intravenous New Bag/Given 03/19/19 2110)  iohexol (OMNIPAQUE) 300 MG/ML solution 100 mL (100 mLs Intravenous Contrast Given 03/19/19 1929)     Initial Impression / Assessment and Plan / ED Course  I have reviewed the triage vital signs and the nursing notes.  Pertinent labs & imaging results that were available during my care of the patient were reviewed by me and considered in my medical decision making (see chart for details).    I reviewed the patient's paper chart arriving with her via EMS. Patient had notable leukocytosis of greater than 20,000, reportedly having a fever of 103 at the clinic. Patient did not receive medication in route, and is afebrile on our initial evaluation.    I discussed the patient's case with our surgical colleagues after CT results were available, concerning for abscess in the left lower quadrant. CT suggest possible diverticular origin, though this is unclear. With concern for this, however, the patient has started antibiotics, will require admission. Surgery will follow as a consulting team, with anticipated consideration of IR evaluation, drainage.    9:12 PM Patient awake and alert, aware of all findings. We reviewed the labs, notable for leukocytosis, abnormal urinalysis, and CT most notable for demonstration of intra-abdominal abscess.  With this  concern, the patient started Zosyn, IV, was admitted for further monitoring, management.  Final Clinical Impressions(s) / ED Diagnoses   Final diagnoses:  Intra-abdominal abscess [facility])     Carmin Muskrat, MD 03/19/19 2114

## 2019-03-19 NOTE — H&P (Signed)
History and Physical    Mariah Wright WNU:[id number] DOB: 03-25-1946 DOA: 03/19/2019  PCP: Shon Baton, MD Patient coming from: Home  Chief Complaint: Abdominal discomfort  HPI: Mariah Wright is a 73 y.o. female with medical history significant of prior history of essential hypertension, endometrial cancer, obstructive sleep apnea with obesity, cholecystectomy, tonsillectomy came to the hospital with complains of fatigue and not feeling well.  Patient states for the past few days she has been feeling " sick" but unable to describe me the sickness.  Went to Dr. Virgina Jock who performed routine examination and asked to hydrate herself.  But at home started having subjective fevers with feeling of weakness therefore came to the hospital for evaluation.  History is somewhat limited and confusing as at times she is a poor historian. In the ER she was noted to have elevated WBC greater than 20 and CT abdomen pelvis showing 6.3X 5.8 centimeter fluid collection in the left pelvis concerning for contained perforation with abscess formation and diverticulitis.  Seen by general surgery who recommended IR drainage and antibiotics.   Review of Systems: As per HPI otherwise 10 point review of systems negative.  Review of Systems Otherwise negative except as per HPI, including: General: Denies  night sweats or unintended weight loss. Resp: Denies cough, wheezing, shortness of breath. Cardiac: Denies chest pain, palpitations, orthopnea, paroxysmal nocturnal dyspnea. GI: Denies abdominal pain, nausea, vomiting, diarrhea or constipation GU: Denies dysuria, frequency, hesitancy or incontinence MS: Denies muscle aches, joint pain or swelling Neuro: Denies headache, neurologic deficits (focal weakness, numbness, tingling), abnormal gait Psych: Denies anxiety, depression, SI/HI/AVH Skin: Denies new rashes or lesions ID: Denies sick contacts, exotic exposures, travel  Past Medical History:  Diagnosis Date    Bilateral knee pain    Endometrial cancer ([place])    Hypertension    Lipoma    RUQ   Obesity     Past Surgical History:  Procedure Laterality Date   CATARACT EXTRACTION     CHOLECYSTECTOMY  2014   TOTAL ABDOMINAL HYSTERECTOMY      SOCIAL HISTORY:  reports that she has quit smoking. She does not have any smokeless tobacco history on file. She reports that she does not drink alcohol or use drugs.  No Known Allergies  FAMILY HISTORY: Family History  Adopted: Yes  Family history unknown: Yes     Prior to Admission medications   Medication Sig Start Date End Date Taking? Authorizing Provider  aspirin 81 MG tablet Take 81 mg by mouth daily.    Yes [provider]  Cholecalciferol (VITAMIN D-3 PO) Take 1 capsule by mouth daily with breakfast.   Yes [provider]  Docusate Calcium (STOOL SOFTENER PO) Take 1 capsule by mouth daily with breakfast.   Yes [provider]  Glucosamine HCl (GLUCOSAMINE PO) Take 2 tablets by mouth daily.   Yes [provider]  lisinopril-hydrochlorothiazide (ZESTORETIC) 20-25 MG tablet Take 1 tablet by mouth daily.   Yes [provider]  vitamin C (ASCORBIC ACID) 500 MG tablet Take 1,000 mg by mouth daily.   Yes [provider]  Multiple Vitamins-Minerals (MULTI FOR HER PO) Take 1 tablet by mouth daily.    [provider]    Physical Exam: Vitals:   03/19/19 2115 03/19/19 2130 03/19/19 2145 03/19/19 2224  BP: 130/78 138/75 138/72 (!) 149/69  Pulse: 99 (!) 101 98 93  Resp: (!) 22 18 (!) 24 18  Temp:    99.2 F (37.3 C)  TempSrc:    Oral  SpO2: 95% 93% 93% 97%  Weight:      Height:          Constitutional: NAD, calm, comfortable Eyes: PERRL, lids and conjunctivae normal ENMT: Mucous membranes are moist. Posterior pharynx clear of any exudate or lesions.Normal dentition.  Neck: normal, supple, no masses, no thyromegaly Respiratory: clear to auscultation bilaterally, no  wheezing, no crackles. Normal respiratory effort. No accessory muscle use.  Cardiovascular: Regular rate and rhythm, no murmurs / rubs / gallops. No extremity edema. 2+ pedal pulses. No carotid bruits.  Abdomen: no tenderness, no masses palpated. No hepatosplenomegaly. Bowel sounds positive.  Musculoskeletal: no clubbing / cyanosis. No joint deformity upper and lower extremities. Good ROM, no contractures. Normal muscle tone.  Skin: no rashes, lesions, ulcers. No induration Neurologic: CN 2-12 grossly intact. Sensation intact, DTR normal. Strength 5/5 in all 4.  Psychiatric: Normal judgment and insight. Alert and oriented x 3. Normal mood.     Labs on Admission: I have personally reviewed following labs and imaging studies  CBC: Recent Labs  Lab 03/19/19 1535  WBC 23.0*  NEUTROABS 20.7*  HGB 12.5  HCT 37.9  MCV 89.0  PLT 774   Basic Metabolic Panel: Recent Labs  Lab 03/19/19 1535  NA 134*  K 3.4*  CL 97*  CO2 24  GLUCOSE 157*  BUN 12  CREATININE 0.85  CALCIUM 9.2   GFR: Estimated Creatinine Clearance: 61 mL/min (by C-G formula based on SCr of 0.85 mg/dL). Liver Function Tests: Recent Labs  Lab 03/19/19 1535  AST 38  ALT 46*  ALKPHOS 60  BILITOT 1.9*  PROT 6.5  ALBUMIN 3.4*   No results for input(s): LIPASE, AMYLASE in the last 168 hours. No results for input(s): AMMONIA in the last 168 hours. Coagulation Profile: No results for input(s): INR, PROTIME in the last 168 hours. Cardiac Enzymes: No results for input(s): CKTOTAL, CKMB, CKMBINDEX, TROPONINI in the last 168 hours. BNP (last 3 results) No results for input(s): PROBNP in the last 8760 hours. HbA1C: No results for input(s): HGBA1C in the last 72 hours. CBG: No results for input(s): GLUCAP in the last 168 hours. Lipid Profile: No results for input(s): CHOL, HDL, LDLCALC, TRIG, CHOLHDL, LDLDIRECT in the last 72 hours. Thyroid Function Tests: No results for input(s): TSH, T4TOTAL, FREET4, T3FREE,  THYROIDAB in the last 72 hours. Anemia Panel: No results for input(s): VITAMINB12, FOLATE, FERRITIN, TIBC, IRON, RETICCTPCT in the last 72 hours. Urine analysis:    Component Value Date/Time   COLORURINE YELLOW 03/19/2019 1745   APPEARANCEUR TURBID (A) 03/19/2019 1745   LABSPEC 1.025 03/19/2019 1745   PHURINE 5.0 03/19/2019 1745   GLUCOSEU NEGATIVE 03/19/2019 1745   HGBUR MODERATE (A) 03/19/2019 1745   BILIRUBINUR NEGATIVE 03/19/2019 1745   KETONESUR NEGATIVE 03/19/2019 1745   PROTEINUR 30 (A) 03/19/2019 1745   NITRITE NEGATIVE 03/19/2019 1745   LEUKOCYTESUR NEGATIVE 03/19/2019 1745   Sepsis Labs: !!!!!!!!!!!!!!!!!!!!!!!!!!!!!!!!!!!!!!!!!!!! @LABRCNTIP (procalcitonin:4,lacticidven:4) )No results found for this or any previous visit (from the past 240 hour(s)).   Radiological Exams on Admission: Ct Abdomen Pelvis W Contrast  Result Date: 03/19/2019 CLINICAL DATA:  Febrile, not feeling well EXAM: CT ABDOMEN AND PELVIS WITH CONTRAST TECHNIQUE: Multidetector CT imaging of the abdomen and pelvis was performed using the standard protocol following bolus administration of intravenous contrast. CONTRAST:  144mL OMNIPAQUE IOHEXOL 300 MG/ML  SOLN COMPARISON:  None. FINDINGS: Lower chest: Lung bases show no acute consolidation or effusion. Normal heart size Hepatobiliary: 2.3  cm cyst within the anterior left hepatic lobe. Status post cholecystectomy. Pancreas: Unremarkable. No pancreatic ductal dilatation or surrounding inflammatory changes. Spleen: Normal in size without focal abnormality. Adrenals/Urinary Tract: Adrenal glands are unremarkable. Kidneys are normal, without renal calculi, focal lesion, or hydronephrosis. Bladder is slightly thick walled with mild surrounding inflammatory changes. Stomach/Bowel: The stomach is unremarkable. No dilated small bowel. Mild segmental thickening involving the sigmoid colon. Vascular/Lymphatic: Nonaneurysmal aorta. Mild aortic atherosclerosis. No significant  adenopathy. Reproductive: Status post hysterectomy. Other: No free air. No significant free fluid. Generalized inflammation within the pelvis. Rim enhancing fluid collection within the left pelvis measuring 6.3 x 5.8 cm, abutting the slightly thickened sigmoid colon. Musculoskeletal: No acute or suspicious abnormality IMPRESSION: 1. 6.3 x 5.8 cm mildly rim enhancing fluid collection within the left pelvis, suspect for infected or inflammatory fluid collection. This abuts the sigmoid colon which appears mildly thickened. Findings could be secondary to contained perforation and subsequent abscess formation, possibly due to diverticulitis although not much diverticular changes are seen in the region. No definitive findings for a colon mass. Alternatively, could consider inflammatory or infected adnexal mass. 2. Generalized edema and inflammation within the pelvis. 3. Slightly thick-walled appearance of the bladder with adjacent edema, which may reflect cystitis or reactive changes from the left pelvic rim enhancing collection. Electronically Signed   By: Donavan Foil M.D.   On: 03/19/2019 20:03   Dg Chest Port 1 View  Result Date: 03/19/2019 CLINICAL DATA:  73 year old female with feeling weak EXAM: PORTABLE CHEST 1 VIEW COMPARISON:  None. FINDINGS: The heart size and mediastinal contours are within normal limits. Both lungs are clear. The visualized skeletal structures are unremarkable. IMPRESSION: Negative for acute cardiopulmonary disease Electronically Signed   By: Corrie Mckusick D.O.   On: 03/19/2019 16:01     All images have been reviewed by me personally.  EKG: Independently reviewed.   Assessment/Plan Principal Problem:   Acute diverticulitis Active Problems:   Benign essential HTN   Endometrial cancer ([facility])   OSA (obstructive sleep apnea)   Acute diverticulitis with abscess formation and likely contained perforation - N.p.o. past midnight, IR for drainage tomorrow morning -Seen by general  surgery.  Gentle hydration - Start patient on Zosyn.  Supportive care. -Unclear if patient has ever had colonoscopy and its findings.  If not recent, will need 1 several weeks from now.  Essential hypertension - Continue oral medications.  History of obstructive sleep apnea - Does not appear she uses CPAP.  Had sleep study back in 2017.  History of endometrial cancer stage I-stable Obesity with BMI greater than 30- will benefit from outpatient weight loss, diet and exercise.  DVT prophylaxis: Subcutaneous heparin Code Status: Full code Family Communication: None at bedside; lives at home alone.  Quite late to call family member as it does not appear to be emergent. Disposition Plan: To be determined Consults called: General surgery Admission status: Inpatient admission to MedSurg   Time Spent: 65 minutes.  >50% of the time was devoted to discussing the patients care, assessment, plan and disposition with other care givers along with counseling the patient about the risks and benefits of treatment.    Antwine Agosto Arsenio Loader MD [facility]  If 7PM-7AM, please contact night-coverage [email or web address]  03/19/2019, 10:41 PM

## 2019-03-19 NOTE — ED Triage Notes (Signed)
Pt brought In by ems for c/o generally" not feeling well" that began yesterday ; upon pcp office she was febrile at 103; pt denies any chest pain/SOB/Cough ;; pt denies any pain at all

## 2019-03-19 NOTE — Consult Note (Signed)
CC: Consult by Dr. Vanita Panda for LLQ abscess, presumed diverticular  HPI: Mariah Wright is an 73 y.o. female with hx of HTN, endometrial ca (stage 1A), obesity, ?OSA whom presented to ED today with complaints of fatigue and subjective fever at home. She reports all this starting in last 24hrs. Denies any sick contacts. Denies CP/SOB. Denies any recent known COVID19 exposure. She denies any hx of abdominal pain. She denies an nausea/vomiting/diarrhea/constipation. Nothing therefore makes it better/worse. No radiation. She was examined in ED and noted to have LLQ tenderness and leukocytosis. She therefore underwent CT which demonstrated ~6 x 6 cm rim enchancing collection in left pelvis suspected inflammatory/infectious. Abuts sigmoid colon. No major diverticular changes noted per se.  She reports having had endometrial ca - per [facility] had robotic TAH/BSO 08/2014  And was diagnosed with stage IA endomet ca. She has been following closely with her gynecologist at [facility] and reports being told she is in remission.  Denies ever having had a colonoscopy  Denies known family hx of GU or colorectal cancer  PSH:  1. Exlap/R salpingo-oophor + wedge L ovary 2. Lap chole 3. Tonsillectomy 4. Robotic TAH/BSO   Past Medical History:  Diagnosis Date   Bilateral knee pain    Endometrial cancer ([place])    Hypertension    Lipoma    RUQ   Obesity     Past Surgical History:  Procedure Laterality Date   CATARACT EXTRACTION     CHOLECYSTECTOMY  2014   TOTAL ABDOMINAL HYSTERECTOMY      Family History  Adopted: Yes  Family history unknown: Yes    Social:  reports that she has quit smoking. She does not have any smokeless tobacco history on file. She reports that she does not drink alcohol or use drugs.  Allergies: No Known Allergies  Medications: I have reviewed the patient's current medications.  Results for orders placed or performed during the hospital encounter of 03/19/19  (from the past 48 hour(s))  Comprehensive metabolic panel     Status: Abnormal   Collection Time: 03/19/19  3:35 PM  Result Value Ref Range   Sodium 134 (L) 135 - 145 mmol/L   Potassium 3.4 (L) 3.5 - 5.1 mmol/L    Comment: SLIGHT HEMOLYSIS   Chloride 97 (L) 98 - 111 mmol/L   CO2 24 22 - 32 mmol/L   Glucose, Bld 157 (H) 70 - 99 mg/dL   BUN 12 8 - 23 mg/dL   Creatinine, Ser 0.85 0.44 - 1.00 mg/dL   Calcium 9.2 8.9 - 10.3 mg/dL   Total Protein 6.5 6.5 - 8.1 g/dL   Albumin 3.4 (L) 3.5 - 5.0 g/dL   AST 38 15 - 41 U/L   ALT 46 (H) 0 - 44 U/L   Alkaline Phosphatase 60 38 - 126 U/L   Total Bilirubin 1.9 (H) 0.3 - 1.2 mg/dL   GFR calc non Af Amer >60 >60 mL/min   GFR calc Af Amer >60 >60 mL/min   Anion gap 13 5 - 15    Comment: Performed at [facility], [address] [place], [place] [address]  CBC with Differential     Status: Abnormal   Collection Time: 03/19/19  3:35 PM  Result Value Ref Range   WBC 23.0 (H) 4.0 - 10.5 K/uL   RBC 4.26 3.87 - 5.11 MIL/uL   Hemoglobin 12.5 12.0 - 15.0 g/dL   HCT 37.9 36.0 - 46.0 %   MCV 89.0 80.0 - 100.0  fL   MCH 29.3 26.0 - 34.0 pg   MCHC 33.0 30.0 - 36.0 g/dL   RDW 14.3 11.5 - 15.5 %   Platelets 246 150 - 400 K/uL   nRBC 0.0 0.0 - 0.2 %   Neutrophils Relative % 90 %   Neutro Abs 20.7 (H) 1.7 - 7.7 K/uL   Lymphocytes Relative 5 %   Lymphs Abs 1.2 0.7 - 4.0 K/uL   Monocytes Relative 4 %   Monocytes Absolute 0.9 0.1 - 1.0 K/uL   Eosinophils Relative 0 %   Eosinophils Absolute 0.0 0.0 - 0.5 K/uL   Basophils Relative 1 %   Basophils Absolute 0.2 (H) 0.0 - 0.1 K/uL   nRBC 0 0 /100 WBC   Abs Immature Granulocytes 0.00 0.00 - 0.07 K/uL    Comment: Performed at [facility] [address] [place], [place] [address]  Lactic acid, plasma     Status: None   Collection Time: 03/19/19  3:35 PM  Result Value Ref Range   Lactic Acid, Venous 1.7 0.5 - 1.9 mmol/L    Comment: Performed at [facility] [address]  [place], [place] [address]  Urinalysis, Routine w reflex microscopic     Status: Abnormal   Collection Time: 03/19/19  5:45 PM  Result Value Ref Range   Color, Urine YELLOW YELLOW   APPearance TURBID (A) CLEAR   Specific Gravity, Urine 1.025 1.005 - 1.030   pH 5.0 5.0 - 8.0   Glucose, UA NEGATIVE NEGATIVE mg/dL   Hgb urine dipstick MODERATE (A) NEGATIVE   Bilirubin Urine NEGATIVE NEGATIVE   Ketones, ur NEGATIVE NEGATIVE mg/dL   Protein, ur 30 (A) NEGATIVE mg/dL   Nitrite NEGATIVE NEGATIVE   Leukocytes,Ua NEGATIVE NEGATIVE   RBC / HPF 0-5 0 - 5 RBC/hpf   WBC, UA 0-5 0 - 5 WBC/hpf   Bacteria, UA MANY (A) NONE SEEN   Squamous Epithelial / LPF 6-10 0 - 5   Mucus PRESENT     Comment: Performed at [facility], [place] [address] [place], [place] [address]    Ct Abdomen Pelvis W Contrast  Result Date: 03/19/2019 CLINICAL DATA:  Febrile, not feeling well EXAM: CT ABDOMEN AND PELVIS WITH CONTRAST TECHNIQUE: Multidetector CT imaging of the abdomen and pelvis was performed using the standard protocol following bolus administration of intravenous contrast. CONTRAST:  160mL OMNIPAQUE IOHEXOL 300 MG/ML  SOLN COMPARISON:  None. FINDINGS: Lower chest: Lung bases show no acute consolidation or effusion. Normal heart size Hepatobiliary: 2.3 cm cyst within the anterior left hepatic lobe. Status post cholecystectomy. Pancreas: Unremarkable. No pancreatic ductal dilatation or surrounding inflammatory changes. Spleen: Normal in size without focal abnormality. Adrenals/Urinary Tract: Adrenal glands are unremarkable. Kidneys are normal, without renal calculi, focal lesion, or hydronephrosis. Bladder is slightly thick walled with mild surrounding inflammatory changes. Stomach/Bowel: The stomach is unremarkable. No dilated small bowel. Mild segmental thickening involving the sigmoid colon. Vascular/Lymphatic: Nonaneurysmal aorta. Mild aortic atherosclerosis. No significant adenopathy. Reproductive: Status post  hysterectomy. Other: No free air. No significant free fluid. Generalized inflammation within the pelvis. Rim enhancing fluid collection within the left pelvis measuring 6.3 x 5.8 cm, abutting the slightly thickened sigmoid colon. Musculoskeletal: No acute or suspicious abnormality IMPRESSION: 1. 6.3 x 5.8 cm mildly rim enhancing fluid collection within the left pelvis, suspect for infected or inflammatory fluid collection. This abuts the sigmoid colon which appears mildly thickened. Findings could be secondary to contained perforation and subsequent abscess  formation, possibly due to diverticulitis although not much diverticular changes are seen in the region. No definitive findings for a colon mass. Alternatively, could consider inflammatory or infected adnexal mass. 2. Generalized edema and inflammation within the pelvis. 3. Slightly thick-walled appearance of the bladder with adjacent edema, which may reflect cystitis or reactive changes from the left pelvic rim enhancing collection. Electronically Signed   By: Donavan Foil M.D.   On: 03/19/2019 20:03   Dg Chest Port 1 View  Result Date: 03/19/2019 CLINICAL DATA:  73 year old female with feeling weak EXAM: PORTABLE CHEST 1 VIEW COMPARISON:  None. FINDINGS: The heart size and mediastinal contours are within normal limits. Both lungs are clear. The visualized skeletal structures are unremarkable. IMPRESSION: Negative for acute cardiopulmonary disease Electronically Signed   By: Corrie Mckusick D.O.   On: 03/19/2019 16:01    ROS - all of the below systems have been reviewed with the patient and positives are indicated with bold text General: chills, fever or night sweats Eyes: blurry vision or double vision ENT: epistaxis or sore throat Allergy/Immunology: itchy/watery eyes or nasal congestion Hematologic/Lymphatic: bleeding problems, blood clots or swollen lymph nodes Endocrine: temperature intolerance or unexpected weight changes Breast: new or  changing breast lumps or nipple discharge Resp: cough, shortness of breath, or wheezing CV: chest pain or dyspnea on exertion GI: as per HPI GU: dysuria, trouble voiding, or hematuria MSK: joint pain or joint stiffness Neuro: TIA or stroke symptoms Derm: pruritus and skin lesion changes Psych: anxiety and depression  PE Blood pressure 134/68, pulse 96, temperature 99.7 F (37.6 C), temperature source Oral, resp. rate (!) 22, height 5\' 4"  (1.626 m), weight 81.6 kg, SpO2 95 %. Constitutional: NAD; conversant; no deformities Eyes: Moist conjunctiva; no lid lag; anicteric; PERRL Neck: Trachea midline; no thyromegaly Lungs: Normal respiratory effort; no tactile fremitus CV: RRR; no palpable thrills; no pitting edema GI: Abd soft, focally ttp in LLQ; no tenderness elsewhere; no rebound; no guarding; no palpable hepatosplenomegaly MSK: Normal gait; no clubbing/cyanosis Psychiatric: Appropriate affect; alert and oriented x3 Lymphatic: No palpable cervical or axillary lymphadenopathy  Results for orders placed or performed during the hospital encounter of 03/19/19 (from the past 48 hour(s))  Comprehensive metabolic panel     Status: Abnormal   Collection Time: 03/19/19  3:35 PM  Result Value Ref Range   Sodium 134 (L) 135 - 145 mmol/L   Potassium 3.4 (L) 3.5 - 5.1 mmol/L    Comment: SLIGHT HEMOLYSIS   Chloride 97 (L) 98 - 111 mmol/L   CO2 24 22 - 32 mmol/L   Glucose, Bld 157 (H) 70 - 99 mg/dL   BUN 12 8 - 23 mg/dL   Creatinine, Ser 0.85 0.44 - 1.00 mg/dL   Calcium 9.2 8.9 - 10.3 mg/dL   Total Protein 6.5 6.5 - 8.1 g/dL   Albumin 3.4 (L) 3.5 - 5.0 g/dL   AST 38 15 - 41 U/L   ALT 46 (H) 0 - 44 U/L   Alkaline Phosphatase 60 38 - 126 U/L   Total Bilirubin 1.9 (H) 0.3 - 1.2 mg/dL   GFR calc non Af Amer >60 >60 mL/min   GFR calc Af Amer >60 >60 mL/min   Anion gap 13 5 - 15    Comment: Performed at [facility], [address] [place], [place] [address]  CBC with Differential      Status: Abnormal   Collection Time: 03/19/19  3:35 PM  Result Value Ref Range  WBC 23.0 (H) 4.0 - 10.5 K/uL   RBC 4.26 3.87 - 5.11 MIL/uL   Hemoglobin 12.5 12.0 - 15.0 g/dL   HCT 37.9 36.0 - 46.0 %   MCV 89.0 80.0 - 100.0 fL   MCH 29.3 26.0 - 34.0 pg   MCHC 33.0 30.0 - 36.0 g/dL   RDW 14.3 11.5 - 15.5 %   Platelets 246 150 - 400 K/uL   nRBC 0.0 0.0 - 0.2 %   Neutrophils Relative % 90 %   Neutro Abs 20.7 (H) 1.7 - 7.7 K/uL   Lymphocytes Relative 5 %   Lymphs Abs 1.2 0.7 - 4.0 K/uL   Monocytes Relative 4 %   Monocytes Absolute 0.9 0.1 - 1.0 K/uL   Eosinophils Relative 0 %   Eosinophils Absolute 0.0 0.0 - 0.5 K/uL   Basophils Relative 1 %   Basophils Absolute 0.2 (H) 0.0 - 0.1 K/uL   nRBC 0 0 /100 WBC   Abs Immature Granulocytes 0.00 0.00 - 0.07 K/uL    Comment: Performed at [facility] [address] [place], [place] [address]  Lactic acid, plasma     Status: None   Collection Time: 03/19/19  3:35 PM  Result Value Ref Range   Lactic Acid, Venous 1.7 0.5 - 1.9 mmol/L    Comment: Performed at [facility] [address] [place], [place] [address]  Urinalysis, Routine w reflex microscopic     Status: Abnormal   Collection Time: 03/19/19  5:45 PM  Result Value Ref Range   Color, Urine YELLOW YELLOW   APPearance TURBID (A) CLEAR   Specific Gravity, Urine 1.025 1.005 - 1.030   pH 5.0 5.0 - 8.0   Glucose, UA NEGATIVE NEGATIVE mg/dL   Hgb urine dipstick MODERATE (A) NEGATIVE   Bilirubin Urine NEGATIVE NEGATIVE   Ketones, ur NEGATIVE NEGATIVE mg/dL   Protein, ur 30 (A) NEGATIVE mg/dL   Nitrite NEGATIVE NEGATIVE   Leukocytes,Ua NEGATIVE NEGATIVE   RBC / HPF 0-5 0 - 5 RBC/hpf   WBC, UA 0-5 0 - 5 WBC/hpf   Bacteria, UA MANY (A) NONE SEEN   Squamous Epithelial / LPF 6-10 0 - 5   Mucus PRESENT     Comment: Performed at [facility], [place] [address] [place], [place] [address]    Ct Abdomen Pelvis W Contrast  Result Date: 03/19/2019 CLINICAL DATA:   Febrile, not feeling well EXAM: CT ABDOMEN AND PELVIS WITH CONTRAST TECHNIQUE: Multidetector CT imaging of the abdomen and pelvis was performed using the standard protocol following bolus administration of intravenous contrast. CONTRAST:  118mL OMNIPAQUE IOHEXOL 300 MG/ML  SOLN COMPARISON:  None. FINDINGS: Lower chest: Lung bases show no acute consolidation or effusion. Normal heart size Hepatobiliary: 2.3 cm cyst within the anterior left hepatic lobe. Status post cholecystectomy. Pancreas: Unremarkable. No pancreatic ductal dilatation or surrounding inflammatory changes. Spleen: Normal in size without focal abnormality. Adrenals/Urinary Tract: Adrenal glands are unremarkable. Kidneys are normal, without renal calculi, focal lesion, or hydronephrosis. Bladder is slightly thick walled with mild surrounding inflammatory changes. Stomach/Bowel: The stomach is unremarkable. No dilated small bowel. Mild segmental thickening involving the sigmoid colon. Vascular/Lymphatic: Nonaneurysmal aorta. Mild aortic atherosclerosis. No significant adenopathy. Reproductive: Status post hysterectomy. Other: No free air. No significant free fluid. Generalized inflammation within the pelvis. Rim enhancing fluid collection within the left pelvis measuring 6.3 x 5.8 cm, abutting the slightly thickened sigmoid colon. Musculoskeletal: No acute or suspicious abnormality IMPRESSION: 1. 6.3 x  5.8 cm mildly rim enhancing fluid collection within the left pelvis, suspect for infected or inflammatory fluid collection. This abuts the sigmoid colon which appears mildly thickened. Findings could be secondary to contained perforation and subsequent abscess formation, possibly due to diverticulitis although not much diverticular changes are seen in the region. No definitive findings for a colon mass. Alternatively, could consider inflammatory or infected adnexal mass. 2. Generalized edema and inflammation within the pelvis. 3. Slightly thick-walled  appearance of the bladder with adjacent edema, which may reflect cystitis or reactive changes from the left pelvic rim enhancing collection. Electronically Signed   By: Donavan Foil M.D.   On: 03/19/2019 20:03   Dg Chest Port 1 View  Result Date: 03/19/2019 CLINICAL DATA:  73 year old female with feeling weak EXAM: PORTABLE CHEST 1 VIEW COMPARISON:  None. FINDINGS: The heart size and mediastinal contours are within normal limits. Both lungs are clear. The visualized skeletal structures are unremarkable. IMPRESSION: Negative for acute cardiopulmonary disease Electronically Signed   By: Corrie Mckusick D.O.   On: 03/19/2019 16:01   A/P: Mariah Wright is an 73 y.o. female with hx HTN, endometrial ca (stage 1A), obesity, ?OSA here with what is most likely diverticulitis/abscess; other considerations being gyn related malignancy but given abrupt onset, leukocytosis and rim enhancement less likely  -We discussed the relevant anatomy/physiology; we discussed pathophysiology of diverticulitis and abscesses - we discussed indications for surgery; given that this is a contained process, we discussed proceeding with IV abx and percutaneous drainage with IR. Her questions were welcomed and answered, she voiced understanding and agreed with plan of care -Will plan for her to be admitted -NPO, MIVF -IV Zosyn -IR consultation for percutaneous drainage (ordered)  Sharon Mt. Dema Severin, M.D. [facility]

## 2019-03-19 NOTE — ED Notes (Signed)
ED TO INPATIENT HANDOFF REPORT  ED Nurse Name and Phone #:  Lenice Pressman  [phone]  S Name/Age/Gender Mariah Wright 73 y.o. female Room/Bed: 028C/028C  Code Status   Code Status: Not on file  Home/SNF/Other Home Patient oriented to: self, place, time and situation Is this baseline? Yes   Triage Complete: Triage complete  Chief Complaint Fever    Triage Note Pt brought In by ems for c/o generally" not feeling well" that began yesterday ; upon pcp office she was febrile at 103; pt denies any chest pain/SOB/Cough ;; pt denies any pain at all   Allergies No Known Allergies  Level of Care/Admitting Diagnosis ED Disposition    ED Disposition Condition [place]: [facility] [[id number]]  Level of Care: Med-Surg [16]  Covid Evaluation: N/A  Diagnosis: Acute diverticulitis [[id number]]  Admitting Physician: Gerlean Ren [facility] [[id number]]  Attending Physician: Gerlean Ren [facility] [[id number]]  Estimated length of stay: past midnight tomorrow  Certification:: I certify this patient will need inpatient services for at least 2 midnights  PT Class (Do Not Modify): Inpatient [101]  PT Acc Code (Do Not Modify): Private [1]       B Medical/Surgery History Past Medical History:  Diagnosis Date  . Bilateral knee pain   . Endometrial cancer ([place])   . Hypertension   . Lipoma    RUQ  . Obesity    Past Surgical History:  Procedure Laterality Date  . CATARACT EXTRACTION    . CHOLECYSTECTOMY  2014  . TOTAL ABDOMINAL HYSTERECTOMY       A IV Location/Drains/Wounds Patient Lines/Drains/Airways Status   Active Line/Drains/Airways    Name:   Placement date:   Placement time:   Site:   Days:   Peripheral IV 03/19/19 Left Antecubital   03/19/19    1547    Antecubital   less than 1          Intake/Output Last 24 hours No intake or output data in the 24 hours ending 03/19/19 2131  Labs/Imaging Results for orders placed or performed during the  hospital encounter of 03/19/19 (from the past 48 hour(s))  Comprehensive metabolic panel     Status: Abnormal   Collection Time: 03/19/19  3:35 PM  Result Value Ref Range   Sodium 134 (L) 135 - 145 mmol/L   Potassium 3.4 (L) 3.5 - 5.1 mmol/L    Comment: SLIGHT HEMOLYSIS   Chloride 97 (L) 98 - 111 mmol/L   CO2 24 22 - 32 mmol/L   Glucose, Bld 157 (H) 70 - 99 mg/dL   BUN 12 8 - 23 mg/dL   Creatinine, Ser 0.85 0.44 - 1.00 mg/dL   Calcium 9.2 8.9 - 10.3 mg/dL   Total Protein 6.5 6.5 - 8.1 g/dL   Albumin 3.4 (L) 3.5 - 5.0 g/dL   AST 38 15 - 41 U/L   ALT 46 (H) 0 - 44 U/L   Alkaline Phosphatase 60 38 - 126 U/L   Total Bilirubin 1.9 (H) 0.3 - 1.2 mg/dL   GFR calc non Af Amer >60 >60 mL/min   GFR calc Af Amer >60 >60 mL/min   Anion gap 13 5 - 15    Comment: Performed at [facility], [address] [place], [place] [address]  CBC with Differential     Status: Abnormal   Collection Time: 03/19/19  3:35 PM  Result Value Ref Range   WBC 23.0 (H) 4.0 - 10.5  K/uL   RBC 4.26 3.87 - 5.11 MIL/uL   Hemoglobin 12.5 12.0 - 15.0 g/dL   HCT 37.9 36.0 - 46.0 %   MCV 89.0 80.0 - 100.0 fL   MCH 29.3 26.0 - 34.0 pg   MCHC 33.0 30.0 - 36.0 g/dL   RDW 14.3 11.5 - 15.5 %   Platelets 246 150 - 400 K/uL   nRBC 0.0 0.0 - 0.2 %   Neutrophils Relative % 90 %   Neutro Abs 20.7 (H) 1.7 - 7.7 K/uL   Lymphocytes Relative 5 %   Lymphs Abs 1.2 0.7 - 4.0 K/uL   Monocytes Relative 4 %   Monocytes Absolute 0.9 0.1 - 1.0 K/uL   Eosinophils Relative 0 %   Eosinophils Absolute 0.0 0.0 - 0.5 K/uL   Basophils Relative 1 %   Basophils Absolute 0.2 (H) 0.0 - 0.1 K/uL   nRBC 0 0 /100 WBC   Abs Immature Granulocytes 0.00 0.00 - 0.07 K/uL    Comment: Performed at [facility] [address], [place], [place] [address]  Lactic acid, plasma     Status: None   Collection Time: 03/19/19  3:35 PM  Result Value Ref Range   Lactic Acid, Venous 1.7 0.5 - 1.9 mmol/L    Comment: Performed at [facility] [address], [place], [place] [address]  Urinalysis, Routine w reflex microscopic     Status: Abnormal   Collection Time: 03/19/19  5:45 PM  Result Value Ref Range   Color, Urine YELLOW YELLOW   APPearance TURBID (A) CLEAR   Specific Gravity, Urine 1.025 1.005 - 1.030   pH 5.0 5.0 - 8.0   Glucose, UA NEGATIVE NEGATIVE mg/dL   Hgb urine dipstick MODERATE (A) NEGATIVE   Bilirubin Urine NEGATIVE NEGATIVE   Ketones, ur NEGATIVE NEGATIVE mg/dL   Protein, ur 30 (A) NEGATIVE mg/dL   Nitrite NEGATIVE NEGATIVE   Leukocytes,Ua NEGATIVE NEGATIVE   RBC / HPF 0-5 0 - 5 RBC/hpf   WBC, UA 0-5 0 - 5 WBC/hpf   Bacteria, UA MANY (A) NONE SEEN   Squamous Epithelial / LPF 6-10 0 - 5   Mucus PRESENT     Comment: Performed at [facility], [place] [address], [place], [place] [address]   Ct Abdomen Pelvis W Contrast  Result Date: 03/19/2019 CLINICAL DATA:  Febrile, not feeling well EXAM: CT ABDOMEN AND PELVIS WITH CONTRAST TECHNIQUE: Multidetector CT imaging of the abdomen and pelvis was performed using the standard protocol following bolus administration of intravenous contrast. CONTRAST:  145mL OMNIPAQUE IOHEXOL 300 MG/ML  SOLN COMPARISON:  None. FINDINGS: Lower chest: Lung bases show no acute consolidation or effusion. Normal heart size Hepatobiliary: 2.3 cm cyst within the anterior left hepatic lobe. Status post cholecystectomy. Pancreas: Unremarkable. No pancreatic ductal dilatation or surrounding inflammatory changes. Spleen: Normal in size without focal abnormality. Adrenals/Urinary Tract: Adrenal glands are unremarkable. Kidneys are normal, without renal calculi, focal lesion, or hydronephrosis. Bladder is slightly thick walled with mild surrounding inflammatory changes. Stomach/Bowel: The stomach is unremarkable. No dilated small bowel. Mild segmental thickening involving the sigmoid colon. Vascular/Lymphatic: Nonaneurysmal aorta. Mild aortic atherosclerosis. No significant adenopathy. Reproductive:  Status post hysterectomy. Other: No free air. No significant free fluid. Generalized inflammation within the pelvis. Rim enhancing fluid collection within the left pelvis measuring 6.3 x 5.8 cm, abutting the slightly thickened sigmoid colon. Musculoskeletal: No acute or suspicious abnormality IMPRESSION: 1. 6.3 x 5.8 cm mildly rim enhancing fluid  collection within the left pelvis, suspect for infected or inflammatory fluid collection. This abuts the sigmoid colon which appears mildly thickened. Findings could be secondary to contained perforation and subsequent abscess formation, possibly due to diverticulitis although not much diverticular changes are seen in the region. No definitive findings for a colon mass. Alternatively, could consider inflammatory or infected adnexal mass. 2. Generalized edema and inflammation within the pelvis. 3. Slightly thick-walled appearance of the bladder with adjacent edema, which may reflect cystitis or reactive changes from the left pelvic rim enhancing collection. Electronically Signed   By: Donavan Foil M.D.   On: 03/19/2019 20:03   Dg Chest Port 1 View  Result Date: 03/19/2019 CLINICAL DATA:  73 year old female with feeling weak EXAM: PORTABLE CHEST 1 VIEW COMPARISON:  None. FINDINGS: The heart size and mediastinal contours are within normal limits. Both lungs are clear. The visualized skeletal structures are unremarkable. IMPRESSION: Negative for acute cardiopulmonary disease Electronically Signed   By: Corrie Mckusick D.O.   On: 03/19/2019 16:01    Pending Labs Unresulted Labs (From admission, onward)   None      Vitals/Pain Today's Vitals   03/19/19 1730 03/19/19 2108 03/19/19 2109 03/19/19 2111  BP: 134/68 134/67    Pulse: 96  80   Resp: (!) 22 (!) 22 19   Temp:      TempSrc:      SpO2: 95%  93%   Weight:      Height:      PainSc:    0-No pain    Isolation Precautions No active isolations  Medications Medications  piperacillin-tazobactam (ZOSYN)  IVPB 3.375 g (3.375 g Intravenous New Bag/Given 03/19/19 2110)  iohexol (OMNIPAQUE) 300 MG/ML solution 100 mL (100 mLs Intravenous Contrast Given 03/19/19 1929)    Mobility walks Low fall risk   Focused Assessments    R Recommendations: See Admitting Provider Note  Report given to:   Additional Notes:

## 2019-03-20 ENCOUNTER — Encounter (HOSPITAL_COMMUNITY): Payer: Self-pay | Admitting: General Practice

## 2019-03-20 ENCOUNTER — Inpatient Hospital Stay (HOSPITAL_COMMUNITY): Payer: Medicare Other

## 2019-03-20 DIAGNOSIS — A419 Sepsis, unspecified organism: Secondary | ICD-10-CM

## 2019-03-20 LAB — CBC
HCT: 36.9 % (ref 36.0–46.0)
Hemoglobin: 12.2 g/dL (ref 12.0–15.0)
MCH: 29.3 pg (ref 26.0–34.0)
MCHC: 33.1 g/dL (ref 30.0–36.0)
MCV: 88.7 fL (ref 80.0–100.0)
Platelets: 239 10*3/uL (ref 150–400)
RBC: 4.16 MIL/uL (ref 3.87–5.11)
RDW: 14.2 % (ref 11.5–15.5)
WBC: 20.6 10*3/uL — ABNORMAL HIGH (ref 4.0–10.5)
nRBC: 0 % (ref 0.0–0.2)

## 2019-03-20 LAB — COMPREHENSIVE METABOLIC PANEL
ALT: 39 U/L (ref 0–44)
AST: 24 U/L (ref 15–41)
Albumin: 3.1 g/dL — ABNORMAL LOW (ref 3.5–5.0)
Alkaline Phosphatase: 69 U/L (ref 38–126)
Anion gap: 11 (ref 5–15)
BUN: 8 mg/dL (ref 8–23)
CO2: 27 mmol/L (ref 22–32)
Calcium: 9.1 mg/dL (ref 8.9–10.3)
Chloride: 99 mmol/L (ref 98–111)
Creatinine, Ser: 1.04 mg/dL — ABNORMAL HIGH (ref 0.44–1.00)
GFR calc Af Amer: >60 mL/min (ref >60)
GFR calc non Af Amer: 53 mL/min — ABNORMAL LOW (ref >60)
Glucose, Bld: 147 mg/dL — ABNORMAL HIGH (ref 70–99)
Potassium: 3.3 mmol/L — ABNORMAL LOW (ref 3.5–5.1)
Sodium: 137 mmol/L (ref 135–145)
Total Bilirubin: 1.8 mg/dL — ABNORMAL HIGH (ref 0.3–1.2)
Total Protein: 6.4 g/dL — ABNORMAL LOW (ref 6.5–8.1)

## 2019-03-20 LAB — PROTIME-INR
INR: 1.4 — ABNORMAL HIGH (ref 0.8–1.2)
Prothrombin Time: 16.7 seconds — ABNORMAL HIGH (ref 11.4–15.2)

## 2019-03-20 LAB — GLUCOSE, CAPILLARY
Glucose-Capillary: 141 mg/dL — ABNORMAL HIGH (ref 70–99)
Glucose-Capillary: 119 mg/dL — ABNORMAL HIGH (ref 70–99)
Glucose-Capillary: 134 mg/dL — ABNORMAL HIGH (ref 70–99)

## 2019-03-20 MED ORDER — MIDAZOLAM HCL 2 MG/2ML IJ SOLN
INTRAMUSCULAR | Status: AC
  Filled 2019-03-20: qty 4

## 2019-03-20 MED ORDER — FENTANYL CITRATE (PF) 100 MCG/2ML IJ SOLN
INTRAMUSCULAR | Status: AC
  Filled 2019-03-20: qty 4

## 2019-03-20 MED ORDER — PIPERACILLIN-TAZOBACTAM 3.375 G IVPB
3.3750 g | Freq: Three times a day (TID) | INTRAVENOUS | Status: DC
  Administered 2019-03-20 – 2019-03-22 (×7): 3.375 g via INTRAVENOUS
  Filled 2019-03-20 (×5): qty 50

## 2019-03-20 MED ORDER — HEPARIN SODIUM (PORCINE) 5000 UNIT/ML IJ SOLN
5000.0000 [IU] | Freq: Three times a day (TID) | INTRAMUSCULAR | Status: DC
  Administered 2019-03-21 (×2): 5000 [IU] via SUBCUTANEOUS
  Filled 2019-03-20 (×3): qty 1

## 2019-03-20 MED ORDER — SODIUM CHLORIDE 0.9% FLUSH
5.0000 mL | Freq: Two times a day (BID) | INTRAVENOUS | Status: DC
  Administered 2019-03-20 – 2019-03-22 (×4): 5 mL

## 2019-03-20 MED ORDER — FENTANYL CITRATE (PF) 100 MCG/2ML IJ SOLN
INTRAMUSCULAR | Status: AC | PRN
  Administered 2019-03-20: 25 ug via INTRAVENOUS
  Administered 2019-03-20: 50 ug via INTRAVENOUS
  Administered 2019-03-20: 25 ug via INTRAVENOUS

## 2019-03-20 MED ORDER — MIDAZOLAM HCL 2 MG/2ML IJ SOLN
INTRAMUSCULAR | Status: AC | PRN
  Administered 2019-03-20 (×2): 0.5 mg via INTRAVENOUS
  Administered 2019-03-20: 1 mg via INTRAVENOUS

## 2019-03-20 NOTE — Consult Note (Signed)
Chief Complaint: Patient was seen in consultation today for diverticular abscess drain Chief Complaint  Patient presents with   Fatigue   at the request of Dr Jake Samples   Supervising Physician: Aletta Edouard  Patient Status: [facility]  History of Present Illness: Mariah Wright is a 73 y.o. female   Mild LLQ pain Feeling "sick" for few days Leukocytosis  CT yesterday  IMPRESSION: 1. 6.3 x 5.8 cm mildly rim enhancing fluid collection within the left pelvis, suspect for infected or inflammatory fluid collection. This abuts the sigmoid colon which appears mildly thickened. Findings could be secondary to contained perforation and subsequent abscess formation, possibly due to diverticulitis although not much diverticular changes are seen in the region. No definitive findings for a colon mass. Alternatively, could consider inflammatory or infected adnexal mass. 2. Generalized edema and inflammation within the pelvis. 3. Slightly thick-walled appearance of the bladder with adjacent edema, which may reflect cystitis or reactive changes from the left pelvic rim enhancing collection.  Request made for drain placement Dr Kathlene Cote has reviewed imaging and approves procedure  LD Hep injection 530 this am   Past Medical History:  Diagnosis Date   Acute diverticulitis 03/19/2019   Bilateral knee pain    Endometrial cancer ([place])    Hypertension    Lipoma    RUQ   Obesity     Past Surgical History:  Procedure Laterality Date   CATARACT EXTRACTION     CHOLECYSTECTOMY  2014   TOTAL ABDOMINAL HYSTERECTOMY      Allergies: Patient has no known allergies.  Medications: Prior to Admission medications   Medication Sig Start Date End Date Taking? Authorizing Provider  aspirin 81 MG tablet Take 81 mg by mouth daily.    Yes [provider]  Cholecalciferol (VITAMIN D-3 PO) Take 1 capsule by mouth daily with breakfast.   Yes [provider]    Docusate Calcium (STOOL SOFTENER PO) Take 1 capsule by mouth daily with breakfast.   Yes [provider]  Glucosamine HCl (GLUCOSAMINE PO) Take 2 tablets by mouth daily.   Yes [provider]  lisinopril-hydrochlorothiazide (ZESTORETIC) 20-25 MG tablet Take 1 tablet by mouth daily.   Yes [provider]  vitamin C (ASCORBIC ACID) 500 MG tablet Take 1,000 mg by mouth daily.   Yes [provider]  Multiple Vitamins-Minerals (MULTI FOR HER PO) Take 1 tablet by mouth daily.    [provider]     Family History  Adopted: Yes  Family history unknown: Yes    Social History   Socioeconomic History   Marital status: Legally Separated    Spouse name: Not on file   Number of children: Not on file   Years of education: Not on file   Highest education level: Not on file  Occupational History   Not on file  Social Needs   Financial resource strain: Not on file   Food insecurity:    Worry: Not on file    Inability: Not on file   Transportation needs:    Medical: Not on file    Non-medical: Not on file  Tobacco Use   Smoking status: Former Smoker   Smokeless tobacco: Never Used  Substance and Sexual Activity   Alcohol use: No   Drug use: No   Sexual activity: Not on file  Lifestyle   Physical activity:    Days per week: Not on file    Minutes per session: Not on file  Stress: Not on file  Relationships   Social connections:    Talks on phone: Not on file    Gets together: Not on file    Attends religious service: Not on file    Active member of club or organization: Not on file    Attends meetings of clubs or organizations: Not on file    Relationship status: Not on file  Other Topics Concern   Not on file  Social History Narrative   Not on file    Review of Systems: A 12 point ROS discussed and pertinent positives are indicated in the HPI above.  All other systems are negative.  Review of Systems   Constitutional: Positive for activity change. Negative for fatigue and fever.  Respiratory: Negative for cough and shortness of breath.   Cardiovascular: Negative for chest pain.  Gastrointestinal: Positive for abdominal pain.  Neurological: Negative for weakness.  Psychiatric/Behavioral: Negative for behavioral problems and confusion.    Vital Signs: BP 122/64 (BP Location: Right Arm)    Pulse 87    Temp (!) 100.9 F (38.3 C) (Oral)    Resp 18    Ht 5\' 4"  (1.626 m)    Wt 180 lb (81.6 kg)    SpO2 96%    BMI 30.90 kg/m   Physical Exam Vitals signs reviewed.  Cardiovascular:     Rate and Rhythm: Normal rate and regular rhythm.     Heart sounds: Normal heart sounds.  Pulmonary:     Breath sounds: Normal breath sounds.  Abdominal:     General: Bowel sounds are normal.     Tenderness: There is abdominal tenderness.  Musculoskeletal: Normal range of motion.  Skin:    General: Skin is warm and dry.  Neurological:     Mental Status: She is alert and oriented to person, place, and time.  Psychiatric:        Mood and Affect: Mood normal.        Behavior: Behavior normal.        Thought Content: Thought content normal.        Judgment: Judgment normal.     Imaging: Ct Abdomen Pelvis W Contrast  Result Date: 03/19/2019 CLINICAL DATA:  Febrile, not feeling well EXAM: CT ABDOMEN AND PELVIS WITH CONTRAST TECHNIQUE: Multidetector CT imaging of the abdomen and pelvis was performed using the standard protocol following bolus administration of intravenous contrast. CONTRAST:  173mL OMNIPAQUE IOHEXOL 300 MG/ML  SOLN COMPARISON:  None. FINDINGS: Lower chest: Lung bases show no acute consolidation or effusion. Normal heart size Hepatobiliary: 2.3 cm cyst within the anterior left hepatic lobe. Status post cholecystectomy. Pancreas: Unremarkable. No pancreatic ductal dilatation or surrounding inflammatory changes. Spleen: Normal in size without focal abnormality. Adrenals/Urinary Tract: Adrenal  glands are unremarkable. Kidneys are normal, without renal calculi, focal lesion, or hydronephrosis. Bladder is slightly thick walled with mild surrounding inflammatory changes. Stomach/Bowel: The stomach is unremarkable. No dilated small bowel. Mild segmental thickening involving the sigmoid colon. Vascular/Lymphatic: Nonaneurysmal aorta. Mild aortic atherosclerosis. No significant adenopathy. Reproductive: Status post hysterectomy. Other: No free air. No significant free fluid. Generalized inflammation within the pelvis. Rim enhancing fluid collection within the left pelvis measuring 6.3 x 5.8 cm, abutting the slightly thickened sigmoid colon. Musculoskeletal: No acute or suspicious abnormality IMPRESSION: 1. 6.3 x 5.8 cm mildly rim enhancing fluid collection within the left pelvis, suspect for infected or inflammatory fluid collection. This abuts the sigmoid colon which appears mildly thickened. Findings could be secondary to contained perforation  and subsequent abscess formation, possibly due to diverticulitis although not much diverticular changes are seen in the region. No definitive findings for a colon mass. Alternatively, could consider inflammatory or infected adnexal mass. 2. Generalized edema and inflammation within the pelvis. 3. Slightly thick-walled appearance of the bladder with adjacent edema, which may reflect cystitis or reactive changes from the left pelvic rim enhancing collection. Electronically Signed   By: Donavan Foil M.D.   On: 03/19/2019 20:03   Dg Chest Port 1 View  Result Date: 03/19/2019 CLINICAL DATA:  73 year old female with feeling weak EXAM: PORTABLE CHEST 1 VIEW COMPARISON:  None. FINDINGS: The heart size and mediastinal contours are within normal limits. Both lungs are clear. The visualized skeletal structures are unremarkable. IMPRESSION: Negative for acute cardiopulmonary disease Electronically Signed   By: Corrie Mckusick D.O.   On: 03/19/2019 16:01     Labs:  CBC: Recent Labs    03/19/19 1535 03/19/19 2314 03/20/19 0353  WBC 23.0* 21.6* 20.6*  HGB 12.5 12.3 12.2  HCT 37.9 37.2 36.9  PLT 246 241 239    COAGS: Recent Labs    03/20/19 0721  INR 1.4*    BMP: Recent Labs    03/19/19 1535 03/19/19 2314 03/20/19 0353  NA 134* 134* 137  K 3.4* 3.3* 3.3*  CL 97* 97* 99  CO2 24 27 27   GLUCOSE 157* 157* 147*  BUN 12 10 8   CALCIUM 9.2 9.2 9.1  CREATININE 0.85 1.01* 1.04*  GFRNONAA >60 55* 53*  GFRAA >60 >60 >60    LIVER FUNCTION TESTS: Recent Labs    03/19/19 1535 03/19/19 2314 03/20/19 0353  BILITOT 1.9* 2.1* 1.8*  AST 38 26 24  ALT 46* 43 39  ALKPHOS 60 59 69  PROT 6.5 6.8 6.4*  ALBUMIN 3.4* 3.2* 3.1*    TUMOR MARKERS: No results for input(s): AFPTM, CEA, CA199, CHROMGRNA in the last 8760 hours.  Assessment and Plan:  Diverticular abscess Scheduled for drain placement in IR Risks and benefits discussed with the patient including bleeding, infection, damage to adjacent structures, bowel perforation/fistula connection, and sepsis.  All of the patient's questions were answered, patient is agreeable to proceed. Consent signed and in chart.   Thank you for this interesting consult.  I greatly enjoyed meeting REBBIE LAURICELLA and look forward to participating in their care.  A copy of this report was sent to the requesting provider on this date.  Electronically Signed: Lavonia Drafts, PA-C 03/20/2019, 11:25 AM   I spent a total of 40 Minutes    in face to face in clinical consultation, greater than 50% of which was counseling/coordinating care for diverticular abscess drain

## 2019-03-20 NOTE — Progress Notes (Signed)
[facility] Progress Note     Subjective: CC: diverticulitis Patient denies abdominal pain. Denies nausea but feels hungry slightly. Not passing flatus. Has never had a colonoscopy previously and denies any past known episodes of diverticulitis.   Objective: Vital signs in last 24 hours: Temp:  [99.2 F (37.3 C)-101.6 F (38.7 C)] 100.9 F (38.3 C) (04/23 0605) Pulse Rate:  [80-101] 87 (04/23 0509) Resp:  [18-24] 18 (04/23 0509) BP: (122-149)/(64-78) 122/64 (04/23 0509) SpO2:  [93 %-98 %] 96 % (04/23 0509) Weight:  [81.6 kg] 81.6 kg (04/22 1539) Last BM Date: 03/19/19  Intake/Output from previous day: No intake/output data recorded. Intake/Output this shift: Total I/O In: 60 [P.O.:60] Out: -   PE: Gen:  Alert, NAD, pleasant Card:  Regular rate and rhythm Pulm:  Normal effort, clear to auscultation bilaterally Abd: Soft, TTP in LLQ, non-distended, +BS Skin: warm and dry, no rashes  Psych: A&Ox3   Lab Results:  Recent Labs    03/19/19 2314 03/20/19 0353  WBC 21.6* 20.6*  HGB 12.3 12.2  HCT 37.2 36.9  PLT 241 239   BMET Recent Labs    03/19/19 2314 03/20/19 0353  NA 134* 137  K 3.3* 3.3*  CL 97* 99  CO2 27 27  GLUCOSE 157* 147*  BUN 10 8  CREATININE 1.01* 1.04*  CALCIUM 9.2 9.1   PT/INR Recent Labs    03/20/19 0721  LABPROT 16.7*  INR 1.4*   CMP     Component Value Date/Time   NA 137 03/20/2019 0353   K 3.3 (L) 03/20/2019 0353   CL 99 03/20/2019 0353   CO2 27 03/20/2019 0353   GLUCOSE 147 (H) 03/20/2019 0353   BUN 8 03/20/2019 0353   CREATININE 1.04 (H) 03/20/2019 0353   CALCIUM 9.1 03/20/2019 0353   PROT 6.4 (L) 03/20/2019 0353   ALBUMIN 3.1 (L) 03/20/2019 0353   AST 24 03/20/2019 0353   ALT 39 03/20/2019 0353   ALKPHOS 69 03/20/2019 0353   BILITOT 1.8 (H) 03/20/2019 0353   GFRNONAA 53 (L) 03/20/2019 0353   GFRAA >60 03/20/2019 0353   Lipase  No results found for: LIPASE     Studies/Results: Ct Abdomen Pelvis W  Contrast  Result Date: 03/19/2019 CLINICAL DATA:  Febrile, not feeling well EXAM: CT ABDOMEN AND PELVIS WITH CONTRAST TECHNIQUE: Multidetector CT imaging of the abdomen and pelvis was performed using the standard protocol following bolus administration of intravenous contrast. CONTRAST:  134mL OMNIPAQUE IOHEXOL 300 MG/ML  SOLN COMPARISON:  None. FINDINGS: Lower chest: Lung bases show no acute consolidation or effusion. Normal heart size Hepatobiliary: 2.3 cm cyst within the anterior left hepatic lobe. Status post cholecystectomy. Pancreas: Unremarkable. No pancreatic ductal dilatation or surrounding inflammatory changes. Spleen: Normal in size without focal abnormality. Adrenals/Urinary Tract: Adrenal glands are unremarkable. Kidneys are normal, without renal calculi, focal lesion, or hydronephrosis. Bladder is slightly thick walled with mild surrounding inflammatory changes. Stomach/Bowel: The stomach is unremarkable. No dilated small bowel. Mild segmental thickening involving the sigmoid colon. Vascular/Lymphatic: Nonaneurysmal aorta. Mild aortic atherosclerosis. No significant adenopathy. Reproductive: Status post hysterectomy. Other: No free air. No significant free fluid. Generalized inflammation within the pelvis. Rim enhancing fluid collection within the left pelvis measuring 6.3 x 5.8 cm, abutting the slightly thickened sigmoid colon. Musculoskeletal: No acute or suspicious abnormality IMPRESSION: 1. 6.3 x 5.8 cm mildly rim enhancing fluid collection within the left pelvis, suspect for infected or inflammatory fluid collection. This abuts the sigmoid colon which appears mildly  thickened. Findings could be secondary to contained perforation and subsequent abscess formation, possibly due to diverticulitis although not much diverticular changes are seen in the region. No definitive findings for a colon mass. Alternatively, could consider inflammatory or infected adnexal mass. 2. Generalized edema and  inflammation within the pelvis. 3. Slightly thick-walled appearance of the bladder with adjacent edema, which may reflect cystitis or reactive changes from the left pelvic rim enhancing collection. Electronically Signed   By: Donavan Foil M.D.   On: 03/19/2019 20:03   Dg Chest Port 1 View  Result Date: 03/19/2019 CLINICAL DATA:  73 year old female with feeling weak EXAM: PORTABLE CHEST 1 VIEW COMPARISON:  None. FINDINGS: The heart size and mediastinal contours are within normal limits. Both lungs are clear. The visualized skeletal structures are unremarkable. IMPRESSION: Negative for acute cardiopulmonary disease Electronically Signed   By: Corrie Mckusick D.O.   On: 03/19/2019 16:01    Anti-infectives: Anti-infectives (From admission, onward)   Start     Dose/Rate Route Frequency Ordered Stop   03/20/19 0600  piperacillin-tazobactam (ZOSYN) IVPB 3.375 g     3.375 g 12.5 mL/hr over 240 Minutes Intravenous Every 8 hours 03/20/19 0212     03/19/19 2030  piperacillin-tazobactam (ZOSYN) IVPB 3.375 g     3.375 g 100 mL/hr over 30 Minutes Intravenous  Once 03/19/19 2020 03/19/19 2140       Assessment/Plan HTN Hx of endometrial cancer Obesity  Diverticulitis and abscess - CT 4/22: 6.3 x 5.8 cm mildly rim enhancing fluid collection within the left pelvis - WBC 20, Tmax 101 - patient TTP in LLQ, but no peritonitis  - IR consult for percutaneous drainage of collection  - hopefully patient will improve with conservative management - no indications for urgent surgical intervention at this time  FEN: NPO, IVF VTE: SCDs, SQ heparin ID: Zosyn 4/22>>   LOS: 1 day    Brigid Re , [facility] 03/20/2019, 10:19 AM Pager: [place]: [phone]

## 2019-03-20 NOTE — Procedures (Signed)
Interventional Radiology Procedure Note  Procedure: CT Guided Drainage of Left Pelvic Fluid Collection  Complications: None  Estimated Blood Loss: < 10 mL  Findings: 12 Fr drain placed in left pelvic fluid collection with return of clear, yellow fluid. Does not appear to represent a diverticular abscess.   Fluid sample sent for culture analysis and cytology. Drain attached to suction bulb drainage.  Will follow.  Venetia Night. Kathlene Cote, M.D Pager:  [phone]

## 2019-03-20 NOTE — Progress Notes (Addendum)
History and Physical    Mariah Wright JJH:[id number] DOB: 09/28/1946 DOA: 03/19/2019  PCP: Shon Baton, MD Patient coming from: Home  Chief Complaint: Abdominal discomfort  HPI: ROCQUEL Wright is a 73 y.o. female with medical history significant of prior history of essential hypertension, endometrial cancer, obstructive sleep apnea with obesity, cholecystectomy, tonsillectomy came to the hospital with complains of fatigue and not feeling well.  Patient states for the past few days she has been feeling " sick" but unable to describe me the sickness.  Went to Dr. Virgina Jock who performed routine examination and asked to hydrate herself.  But at home started having subjective fevers with feeling of weakness therefore came to the hospital for evaluation.  History is somewhat limited and confusing as at times she is a poor historian. In the ER she was noted to have elevated WBC greater than 20 and CT abdomen pelvis showing 6.3X 5.8 centimeter fluid collection in the left pelvis concerning for contained perforation with abscess formation and diverticulitis.  Seen by general surgery who recommended IR drainage and antibiotics.  Subjective: No acute issues or events overnight, patient currently feels quite well, ongoing general weakness and malaise moderately improved; otherwise declines headaches, fevers, chills, chest pain, shortness of breath, nausea, vomiting, diarrhea, constipation.  Past Medical History:  Diagnosis Date   Acute diverticulitis 03/19/2019   Bilateral knee pain    Endometrial cancer ([facility])    Hypertension    Lipoma    RUQ   Obesity     Past Surgical History:  Procedure Laterality Date   CATARACT EXTRACTION     CHOLECYSTECTOMY  2014   TOTAL ABDOMINAL HYSTERECTOMY      SOCIAL HISTORY:  reports that she has quit smoking. She has never used smokeless tobacco. She reports that she does not drink alcohol or use drugs.  No Known Allergies  FAMILY HISTORY: Family History    Adopted: Yes  Family history unknown: Yes     Prior to Admission medications   Medication Sig Start Date End Date Taking? Authorizing Provider  aspirin 81 MG tablet Take 81 mg by mouth daily.    Yes [provider]  Cholecalciferol (VITAMIN D-3 PO) Take 1 capsule by mouth daily with breakfast.   Yes [provider]  Docusate Calcium (STOOL SOFTENER PO) Take 1 capsule by mouth daily with breakfast.   Yes [provider]  Glucosamine HCl (GLUCOSAMINE PO) Take 2 tablets by mouth daily.   Yes [provider]  lisinopril-hydrochlorothiazide (ZESTORETIC) 20-25 MG tablet Take 1 tablet by mouth daily.   Yes [provider]  vitamin C (ASCORBIC ACID) 500 MG tablet Take 1,000 mg by mouth daily.   Yes [provider]  Multiple Vitamins-Minerals (MULTI FOR HER PO) Take 1 tablet by mouth daily.    [provider]    Physical Exam: Vitals:   03/20/19 1300 03/20/19 1305 03/20/19 1310 03/20/19 1331  BP: 118/65 (!) 117/6 116/62 117/68  Pulse: 83 82 85 81  Resp: 17 20 18    Temp:    98.3 F (36.8 C)  TempSrc:    Oral  SpO2: 100% 98% 97% 94%  Weight:      Height:        General:  Pleasantly resting in bed, No acute distress. HEENT:  Normocephalic atraumatic.  Sclerae nonicteric, noninjected.  Extraocular movements intact bilaterally. Neck:  Without mass or deformity.  Trachea is midline. Lungs:  Clear to auscultate bilaterally without rhonchi, wheeze, or rales. Heart:  Regular rate and rhythm.  Without murmurs, rubs, or gallops. Abdomen:  Soft, nontender, nondistended.  Without guarding or rebound.  Left lower quadrant JP drain intact draining serosanguineous fluid. Extremities: Without cyanosis, clubbing, edema, or obvious deformity. Vascular:  Dorsalis pedis and posterior tibial pulses palpable bilaterally. Skin:  Warm and dry, no erythema, no ulcerations.     Labs on Admission: I have personally reviewed following labs and  imaging studies  CBC: Recent Labs  Lab 03/19/19 1535 03/19/19 2314 03/20/19 0353  WBC 23.0* 21.6* 20.6*  NEUTROABS 20.7*  --   --   HGB 12.5 12.3 12.2  HCT 37.9 37.2 36.9  MCV 89.0 88.2 88.7  PLT 246 241 195   Basic Metabolic Panel: Recent Labs  Lab 03/19/19 1535 03/19/19 2314 03/20/19 0353  NA 134* 134* 137  K 3.4* 3.3* 3.3*  CL 97* 97* 99  CO2 24 27 27   GLUCOSE 157* 157* 147*  BUN 12 10 8   CREATININE 0.85 1.01* 1.04*  CALCIUM 9.2 9.2 9.1  MG  --  1.8  --    GFR: Estimated Creatinine Clearance: 49.8 mL/min (A) (by C-G formula based on SCr of 1.04 mg/dL (H)). Liver Function Tests: Recent Labs  Lab 03/19/19 1535 03/19/19 2314 03/20/19 0353  AST 38 26 24  ALT 46* 43 39  ALKPHOS 60 59 69  BILITOT 1.9* 2.1* 1.8*  PROT 6.5 6.8 6.4*  ALBUMIN 3.4* 3.2* 3.1*   No results for input(s): LIPASE, AMYLASE in the last 168 hours. No results for input(s): AMMONIA in the last 168 hours. Coagulation Profile: Recent Labs  Lab 03/20/19 0721  INR 1.4*   Cardiac Enzymes: No results for input(s): CKTOTAL, CKMB, CKMBINDEX, TROPONINI in the last 168 hours. BNP (last 3 results) No results for input(s): PROBNP in the last 8760 hours. HbA1C: No results for input(s): HGBA1C in the last 72 hours. CBG: Recent Labs  Lab 03/20/19 0754  GLUCAP 141*   Lipid Profile: No results for input(s): CHOL, HDL, LDLCALC, TRIG, CHOLHDL, LDLDIRECT in the last 72 hours. Thyroid Function Tests: No results for input(s): TSH, T4TOTAL, FREET4, T3FREE, THYROIDAB in the last 72 hours. Anemia Panel: No results for input(s): VITAMINB12, FOLATE, FERRITIN, TIBC, IRON, RETICCTPCT in the last 72 hours. Urine analysis:    Component Value Date/Time   COLORURINE YELLOW 03/19/2019 1745   APPEARANCEUR TURBID (A) 03/19/2019 1745   LABSPEC 1.025 03/19/2019 1745   PHURINE 5.0 03/19/2019 1745   GLUCOSEU NEGATIVE 03/19/2019 1745   HGBUR MODERATE (A) 03/19/2019 1745   BILIRUBINUR NEGATIVE 03/19/2019 1745    KETONESUR NEGATIVE 03/19/2019 1745   PROTEINUR 30 (A) 03/19/2019 1745   NITRITE NEGATIVE 03/19/2019 1745   LEUKOCYTESUR NEGATIVE 03/19/2019 1745   Radiological Exams on Admission: Ct Abdomen Pelvis W Contrast  Result Date: 03/19/2019 CLINICAL DATA:  Febrile, not feeling well EXAM: CT ABDOMEN AND PELVIS WITH CONTRAST TECHNIQUE: Multidetector CT imaging of the abdomen and pelvis was performed using the standard protocol following bolus administration of intravenous contrast. CONTRAST:  163mL OMNIPAQUE IOHEXOL 300 MG/ML  SOLN COMPARISON:  None. FINDINGS: Lower chest: Lung bases show no acute consolidation or effusion. Normal heart size Hepatobiliary: 2.3 cm cyst within the anterior left hepatic lobe. Status post cholecystectomy. Pancreas: Unremarkable. No pancreatic ductal dilatation or surrounding inflammatory changes. Spleen: Normal in size without focal abnormality. Adrenals/Urinary Tract: Adrenal glands are unremarkable. Kidneys are normal, without renal calculi, focal lesion, or hydronephrosis. Bladder is slightly thick walled with mild surrounding inflammatory changes. Stomach/Bowel: The stomach is unremarkable.  No dilated small bowel. Mild segmental thickening involving the sigmoid colon. Vascular/Lymphatic: Nonaneurysmal aorta. Mild aortic atherosclerosis. No significant adenopathy. Reproductive: Status post hysterectomy. Other: No free air. No significant free fluid. Generalized inflammation within the pelvis. Rim enhancing fluid collection within the left pelvis measuring 6.3 x 5.8 cm, abutting the slightly thickened sigmoid colon. Musculoskeletal: No acute or suspicious abnormality IMPRESSION: 1. 6.3 x 5.8 cm mildly rim enhancing fluid collection within the left pelvis, suspect for infected or inflammatory fluid collection. This abuts the sigmoid colon which appears mildly thickened. Findings could be secondary to contained perforation and subsequent abscess formation, possibly due to diverticulitis  although not much diverticular changes are seen in the region. No definitive findings for a colon mass. Alternatively, could consider inflammatory or infected adnexal mass. 2. Generalized edema and inflammation within the pelvis. 3. Slightly thick-walled appearance of the bladder with adjacent edema, which may reflect cystitis or reactive changes from the left pelvic rim enhancing collection. Electronically Signed   By: Donavan Foil M.D.   On: 03/19/2019 20:03   Dg Chest Port 1 View  Result Date: 03/19/2019 CLINICAL DATA:  73 year old female with feeling weak EXAM: PORTABLE CHEST 1 VIEW COMPARISON:  None. FINDINGS: The heart size and mediastinal contours are within normal limits. Both lungs are clear. The visualized skeletal structures are unremarkable. IMPRESSION: Negative for acute cardiopulmonary disease Electronically Signed   By: Corrie Mckusick D.O.   On: 03/19/2019 16:01   Ct Image Guided Drainage By Percutaneous Catheter  Result Date: 03/20/2019 CLINICAL DATA:  Left lower quadrant fluid collection felt by imaging to be most likely consistent with diverticular abscess and leukocytosis. EXAM: CT GUIDED CATHETER DRAINAGE OF PERITONEAL LEFT LOWER QUADRANT ABSCESS ANESTHESIA/SEDATION: 2.0 mg IV Versed 100 mcg IV Fentanyl Total Moderate Sedation Time:  24 minutes The patient's level of consciousness and physiologic status were continuously monitored during the procedure by Radiology nursing. PROCEDURE: The procedure, risks, benefits, and alternatives were explained to the patient. Questions regarding the procedure were encouraged and answered. The patient understands and consents to the procedure. A time out was performed prior to initiating the procedure. CT was performed through the lower abdomen and pelvis in a supine position. The left lower abdominal wall was prepped with chlorhexidine in a sterile fashion, and a sterile drape was applied covering the operative field. A sterile gown and sterile gloves  were used for the procedure. Local anesthesia was provided with 1% Lidocaine. Under CT guidance, an 18 gauge trocar needle was advanced to the level of a left lower quadrant fluid collection. After confirming needle tip position, aspiration of fluid was performed. A guidewire was advanced into the collection. The tract was dilated and a 12 French percutaneous drain placed. Drainage catheter position was confirmed by CT. The catheter was attached to a suction bulb. Fluid samples were sent for culture analysis as well as cytology. The catheter was secured at the skin with a Prolene retention suture and StatLock device. COMPLICATIONS: None FINDINGS: Aspiration and additional fluid removal through a percutaneous drain revealed clear, yellowish fluid which does not appear infected. This does not appear to be consistent with a diverticular abscess. Given nature of fluid, the fluid was also sent for cytologic analysis in addition to routine culture studies. After placement of the drain and some fluid aspiration, CT demonstrates significant decompression of the fluid collection. IMPRESSION: CT-guided percutaneous catheter drainage of left lower quadrant pelvic fluid collection yielding clear, yellow fluid. Fluid samples were sent for culture analysis as well  as cytologic analysis. Electronically Signed   By: Aletta Edouard M.D.   On: 03/20/2019 13:54     All images have been reviewed by me personally.  EKG: Independently reviewed.   Assessment/Plan Principal Problem:   Acute diverticulitis Active Problems:   Benign essential HTN   Endometrial cancer ([facility])   OSA (obstructive sleep apnea)   Sepsis ([facility])   Sepsis in the setting of acute diverticulitis with abscess formation and likely contained perforation, POA -Status post injury with IR, tolerated the procedure well, JP drain currently draining serosanguineous fluid -Follow cultures for abdominal fluid -Surgery continues to follow we appreciate insight  and recommendations -Leukocytosis continues to downtrend minimally overnight, follow with morning labs -patient no longer meets sepsis criteria (leukocytosis, febrile, tachypneic overnight -these have now resolved) -T bili now within normal limits after IV fluids -Continue IV fluids, Zosyn (start date 03/19/2019) -will likely need 7 to 10 days minimally for abdominal coverage; likely de-escalate to p.o. antibiotics at discharge presumably Augmentin/similar -Advance diet as tolerated, currently on clear liquids -Unclear history of colonoscopy. Will likely need one post hospital discharge for further evaluation.  Essential hypertension - Resume home lisinopril/HCTZ-20/25 once able to tolerate p.o. safely  ?  History of obstructive sleep apnea -Denies current use of CPAP; reports history of sleep study in 2017.  History of endometrial cancer stage I - stable, no indication for current treatment Vitamin D deficiency resume home supplementation at discharge Obesity with BMI greater than 30- lengthy discussion at bedside about need for lifestyle and dietary changes  DVT prophylaxis: Subcutaneous heparin Code Status: Full code Family Communication: No one available by phone Disposition Plan: Inpatient - pending clinical improvement and resolution of infection. Consults called: General surgery Admission status: Inpatient admission to MedSurg   Time Spent: 65 minutes.  >50% of the time was devoted to discussing the patients care, assessment, plan and disposition with other care givers along with counseling the patient about the risks and benefits of treatment.    Little Ishikawa MD [facility]  If 7PM-7AM, please contact night-coverage [email or web address]  03/20/2019, 2:14 PM

## 2019-03-21 LAB — CBC
HCT: 35.3 % — ABNORMAL LOW (ref 36.0–46.0)
Hemoglobin: 11.3 g/dL — ABNORMAL LOW (ref 12.0–15.0)
MCH: 28.8 pg (ref 26.0–34.0)
MCHC: 32 g/dL (ref 30.0–36.0)
MCV: 89.8 fL (ref 80.0–100.0)
Platelets: 218 10*3/uL (ref 150–400)
RBC: 3.93 MIL/uL (ref 3.87–5.11)
RDW: 14 % (ref 11.5–15.5)
WBC: 13 10*3/uL — ABNORMAL HIGH (ref 4.0–10.5)
nRBC: 0 % (ref 0.0–0.2)

## 2019-03-21 LAB — BASIC METABOLIC PANEL
Anion gap: 12 (ref 5–15)
BUN: 12 mg/dL (ref 8–23)
CO2: 25 mmol/L (ref 22–32)
Calcium: 8.9 mg/dL (ref 8.9–10.3)
Chloride: 102 mmol/L (ref 98–111)
Creatinine, Ser: 1.15 mg/dL — ABNORMAL HIGH (ref 0.44–1.00)
GFR calc Af Amer: 55 mL/min — ABNORMAL LOW (ref >60)
GFR calc non Af Amer: 47 mL/min — ABNORMAL LOW (ref >60)
Glucose, Bld: 127 mg/dL — ABNORMAL HIGH (ref 70–99)
Potassium: 3.6 mmol/L (ref 3.5–5.1)
Sodium: 139 mmol/L (ref 135–145)

## 2019-03-21 LAB — GLUCOSE, CAPILLARY
Glucose-Capillary: 132 mg/dL — ABNORMAL HIGH (ref 70–99)
Glucose-Capillary: 106 mg/dL — ABNORMAL HIGH (ref 70–99)
Glucose-Capillary: 124 mg/dL — ABNORMAL HIGH (ref 70–99)
Glucose-Capillary: 92 mg/dL (ref 70–99)

## 2019-03-21 MED ORDER — VANCOMYCIN HCL IN DEXTROSE 1-5 GM/200ML-% IV SOLN
1000.0000 mg | INTRAVENOUS | Status: DC
  Filled 2019-03-21: qty 200

## 2019-03-21 MED ORDER — VANCOMYCIN HCL 10 G IV SOLR
1500.0000 mg | Freq: Once | INTRAVENOUS | Status: AC
  Administered 2019-03-21: 1500 mg via INTRAVENOUS
  Filled 2019-03-21: qty 1500

## 2019-03-21 NOTE — Progress Notes (Signed)
Temp 102.6 down to 101.2 with tylenol given.  Ambulatory to bathroom with assist, c/o fatigue today.  Paged MD.

## 2019-03-21 NOTE — Plan of Care (Signed)
°  Problem: Education: °Goal: Knowledge of General Education information will improve °Description: Including pain rating scale, medication(s)/side effects and non-pharmacologic comfort measures °Outcome: Progressing °  °Problem: Clinical Measurements: °Goal: Ability to maintain clinical measurements within normal limits will improve °Outcome: Progressing °Goal: Will remain free from infection °Outcome: Progressing °Goal: Respiratory complications will improve °Outcome: Progressing °  °Problem: Activity: °Goal: Risk for activity intolerance will decrease °Outcome: Progressing °  °Problem: Coping: °Goal: Level of anxiety will decrease °Outcome: Progressing °  °Problem: Pain Managment: °Goal: General experience of comfort will improve °Outcome: Progressing °  °

## 2019-03-21 NOTE — Progress Notes (Signed)
History and Physical    Mariah Wright NFA:[id number] DOB: 1946/07/15 DOA: 03/19/2019  PCP: Shon Baton, MD Patient coming from: Home  Chief Complaint: Abdominal discomfort  HPI: Mariah Wright is a 73 y.o. female with medical history significant of prior history of essential hypertension, endometrial cancer, obstructive sleep apnea with obesity, cholecystectomy, tonsillectomy came to the hospital with complains of fatigue and not feeling well.  Patient states for the past few days she has been feeling " sick" but unable to describe me the sickness.  Went to Dr. Virgina Jock who performed routine examination and asked to hydrate herself.  But at home started having subjective fevers with feeling of weakness therefore came to the hospital for evaluation.  History is somewhat limited and confusing as at times she is a poor historian. In the ER she was noted to have elevated WBC greater than 20 and CT abdomen pelvis showing 6.3X 5.8 centimeter fluid collection in the left pelvis concerning for contained perforation with abscess formation and diverticulitis.  Seen by general surgery who recommended IR drainage and antibiotics.  Subjective: Overnight patient had episode of chills, febrile to 102 improved with tylenol as well as nausea but without vomiting. Patient also admits to multiple episodes of loose to watery stool.   Past Medical History:  Diagnosis Date   Acute diverticulitis 03/19/2019   Bilateral knee pain    Endometrial cancer ([place])    Hypertension    Lipoma    RUQ   Obesity     Past Surgical History:  Procedure Laterality Date   CATARACT EXTRACTION     CHOLECYSTECTOMY  2014   TOTAL ABDOMINAL HYSTERECTOMY      SOCIAL HISTORY:  reports that she has quit smoking. She has never used smokeless tobacco. She reports that she does not drink alcohol or use drugs.  No Known Allergies  FAMILY HISTORY: Family History  Adopted: Yes  Family history unknown: Yes     Prior to  Admission medications   Medication Sig Start Date End Date Taking? Authorizing Provider  aspirin 81 MG tablet Take 81 mg by mouth daily.    Yes [provider]  Cholecalciferol (VITAMIN D-3 PO) Take 1 capsule by mouth daily with breakfast.   Yes [provider]  Docusate Calcium (STOOL SOFTENER PO) Take 1 capsule by mouth daily with breakfast.   Yes [provider]  Glucosamine HCl (GLUCOSAMINE PO) Take 2 tablets by mouth daily.   Yes [provider]  lisinopril-hydrochlorothiazide (ZESTORETIC) 20-25 MG tablet Take 1 tablet by mouth daily.   Yes [provider]  vitamin C (ASCORBIC ACID) 500 MG tablet Take 1,000 mg by mouth daily.   Yes [provider]  Multiple Vitamins-Minerals (MULTI FOR HER PO) Take 1 tablet by mouth daily.    [provider]    Physical Exam: Vitals:   03/21/19 4696 03/21/19 0649 03/21/19 1311 03/21/19 1354  BP:    112/75  Pulse:    79  Resp:    18  Temp: (!) 102.6 F (39.2 C) (!) 101.2 F (38.4 C) 98.9 F (37.2 C) 99.5 F (37.5 C)  TempSrc: Oral  Oral Oral  SpO2:    97%  Weight:      Height:        General:  Pleasantly resting in bed, No acute distress. HEENT:  Normocephalic atraumatic.  Sclerae nonicteric, noninjected.  Extraocular movements intact bilaterally. Neck:  Without mass or deformity.  Trachea is midline. Lungs:  Clear to auscultate  bilaterally without rhonchi, wheeze, or rales. Heart:  Regular rate and rhythm.  Without murmurs, rubs, or gallops. Abdomen:  Soft, nontender, nondistended.  Without guarding or rebound.  Left lower quadrant JP drain intact draining serosanguineous fluid. Extremities: Without cyanosis, clubbing, edema, or obvious deformity. Vascular:  Dorsalis pedis and posterior tibial pulses palpable bilaterally. Skin:  Warm and dry, no erythema, no ulcerations.     Labs on Admission: I have personally reviewed following labs and imaging studies  CBC: Recent Labs    Lab 04/01/2019 1535 04/01/19 2314 03/20/19 0353 03/21/19 0128  WBC 23.0* 21.6* 20.6* 13.0*  NEUTROABS 20.7*  --   --   --   HGB 12.5 12.3 12.2 11.3*  HCT 37.9 37.2 36.9 35.3*  MCV 89.0 88.2 88.7 89.8  PLT 246 241 239 009   Basic Metabolic Panel: Recent Labs  Lab 04/01/2019 1535 04-01-19 2314 03/20/19 0353 03/21/19 0128  NA 134* 134* 137 139  K 3.4* 3.3* 3.3* 3.6  CL 97* 97* 99 102  CO2 24 27 27 25   GLUCOSE 157* 157* 147* 127*  BUN 12 10 8 12   CREATININE 0.85 1.01* 1.04* 1.15*  CALCIUM 9.2 9.2 9.1 8.9  MG  --  1.8  --   --    GFR: Estimated Creatinine Clearance: 45.1 mL/min (A) (by C-G formula based on SCr of 1.15 mg/dL (H)). Liver Function Tests: Recent Labs  Lab 04-01-19 1535 Apr 01, 2019 2314 03/20/19 0353  AST 38 26 24  ALT 46* 43 39  ALKPHOS 60 59 69  BILITOT 1.9* 2.1* 1.8*  PROT 6.5 6.8 6.4*  ALBUMIN 3.4* 3.2* 3.1*   No results for input(s): LIPASE, AMYLASE in the last 168 hours. No results for input(s): AMMONIA in the last 168 hours. Coagulation Profile: Recent Labs  Lab 03/20/19 0721  INR 1.4*   Cardiac Enzymes: No results for input(s): CKTOTAL, CKMB, CKMBINDEX, TROPONINI in the last 168 hours. BNP (last 3 results) No results for input(s): PROBNP in the last 8760 hours. HbA1C: No results for input(s): HGBA1C in the last 72 hours. CBG: Recent Labs  Lab 03/20/19 0754 03/20/19 1736 03/20/19 2206 03/21/19 0754 03/21/19 1129  GLUCAP 141* 119* 134* 132* 106*   Lipid Profile: No results for input(s): CHOL, HDL, LDLCALC, TRIG, CHOLHDL, LDLDIRECT in the last 72 hours. Thyroid Function Tests: No results for input(s): TSH, T4TOTAL, FREET4, T3FREE, THYROIDAB in the last 72 hours. Anemia Panel: No results for input(s): VITAMINB12, FOLATE, FERRITIN, TIBC, IRON, RETICCTPCT in the last 72 hours. Urine analysis:    Component Value Date/Time   COLORURINE YELLOW 04/01/2019 1745   APPEARANCEUR TURBID (A) 04/01/19 1745   LABSPEC 1.025 04/01/2019 1745    PHURINE 5.0 Apr 01, 2019 1745   GLUCOSEU NEGATIVE 04-01-19 1745   HGBUR MODERATE (A) 2019/04/01 1745   BILIRUBINUR NEGATIVE Apr 01, 2019 1745   KETONESUR NEGATIVE 04/01/2019 1745   PROTEINUR 30 (A) 2019/04/01 1745   NITRITE NEGATIVE 01-Apr-2019 1745   LEUKOCYTESUR NEGATIVE 04-01-2019 1745   Radiological Exams on Admission: Ct Abdomen Pelvis W Contrast  Result Date: 04-01-19 CLINICAL DATA:  Febrile, not feeling well EXAM: CT ABDOMEN AND PELVIS WITH CONTRAST TECHNIQUE: Multidetector CT imaging of the abdomen and pelvis was performed using the standard protocol following bolus administration of intravenous contrast. CONTRAST:  171mL OMNIPAQUE IOHEXOL 300 MG/ML  SOLN COMPARISON:  None. FINDINGS: Lower chest: Lung bases show no acute consolidation or effusion. Normal heart size Hepatobiliary: 2.3 cm cyst within the anterior left hepatic lobe. Status post cholecystectomy. Pancreas: Unremarkable. No pancreatic  ductal dilatation or surrounding inflammatory changes. Spleen: Normal in size without focal abnormality. Adrenals/Urinary Tract: Adrenal glands are unremarkable. Kidneys are normal, without renal calculi, focal lesion, or hydronephrosis. Bladder is slightly thick walled with mild surrounding inflammatory changes. Stomach/Bowel: The stomach is unremarkable. No dilated small bowel. Mild segmental thickening involving the sigmoid colon. Vascular/Lymphatic: Nonaneurysmal aorta. Mild aortic atherosclerosis. No significant adenopathy. Reproductive: Status post hysterectomy. Other: No free air. No significant free fluid. Generalized inflammation within the pelvis. Rim enhancing fluid collection within the left pelvis measuring 6.3 x 5.8 cm, abutting the slightly thickened sigmoid colon. Musculoskeletal: No acute or suspicious abnormality IMPRESSION: 1. 6.3 x 5.8 cm mildly rim enhancing fluid collection within the left pelvis, suspect for infected or inflammatory fluid collection. This abuts the sigmoid colon  which appears mildly thickened. Findings could be secondary to contained perforation and subsequent abscess formation, possibly due to diverticulitis although not much diverticular changes are seen in the region. No definitive findings for a colon mass. Alternatively, could consider inflammatory or infected adnexal mass. 2. Generalized edema and inflammation within the pelvis. 3. Slightly thick-walled appearance of the bladder with adjacent edema, which may reflect cystitis or reactive changes from the left pelvic rim enhancing collection. Electronically Signed   By: Donavan Foil M.D.   On: 03/19/2019 20:03   Dg Chest Port 1 View  Result Date: 03/19/2019 CLINICAL DATA:  73 year old female with feeling weak EXAM: PORTABLE CHEST 1 VIEW COMPARISON:  None. FINDINGS: The heart size and mediastinal contours are within normal limits. Both lungs are clear. The visualized skeletal structures are unremarkable. IMPRESSION: Negative for acute cardiopulmonary disease Electronically Signed   By: Corrie Mckusick D.O.   On: 03/19/2019 16:01   Ct Image Guided Drainage By Percutaneous Catheter  Result Date: 03/20/2019 CLINICAL DATA:  Left lower quadrant fluid collection felt by imaging to be most likely consistent with diverticular abscess and leukocytosis. EXAM: CT GUIDED CATHETER DRAINAGE OF PERITONEAL LEFT LOWER QUADRANT ABSCESS ANESTHESIA/SEDATION: 2.0 mg IV Versed 100 mcg IV Fentanyl Total Moderate Sedation Time:  24 minutes The patient's level of consciousness and physiologic status were continuously monitored during the procedure by Radiology nursing. PROCEDURE: The procedure, risks, benefits, and alternatives were explained to the patient. Questions regarding the procedure were encouraged and answered. The patient understands and consents to the procedure. A time out was performed prior to initiating the procedure. CT was performed through the lower abdomen and pelvis in a supine position. The left lower abdominal wall  was prepped with chlorhexidine in a sterile fashion, and a sterile drape was applied covering the operative field. A sterile gown and sterile gloves were used for the procedure. Local anesthesia was provided with 1% Lidocaine. Under CT guidance, an 18 gauge trocar needle was advanced to the level of a left lower quadrant fluid collection. After confirming needle tip position, aspiration of fluid was performed. A guidewire was advanced into the collection. The tract was dilated and a 12 French percutaneous drain placed. Drainage catheter position was confirmed by CT. The catheter was attached to a suction bulb. Fluid samples were sent for culture analysis as well as cytology. The catheter was secured at the skin with a Prolene retention suture and StatLock device. COMPLICATIONS: None FINDINGS: Aspiration and additional fluid removal through a percutaneous drain revealed clear, yellowish fluid which does not appear infected. This does not appear to be consistent with a diverticular abscess. Given nature of fluid, the fluid was also sent for cytologic analysis in addition to routine  culture studies. After placement of the drain and some fluid aspiration, CT demonstrates significant decompression of the fluid collection. IMPRESSION: CT-guided percutaneous catheter drainage of left lower quadrant pelvic fluid collection yielding clear, yellow fluid. Fluid samples were sent for culture analysis as well as cytologic analysis. Electronically Signed   By: Aletta Edouard M.D.   On: 03/20/2019 13:54     All images have been reviewed by me personally.  EKG: Independently reviewed.   Assessment/Plan Principal Problem:   Acute diverticulitis Active Problems:   Benign essential HTN   Endometrial cancer (HCC)   OSA (obstructive sleep apnea)   Sepsis (HCC)   Sepsis in the setting of acute diverticulitis with abscess formation and likely contained perforation, POA, improving -Status post drainage with IR, tolerated  the procedure well, JP drain currently draining clear to serosanguineous fluid -Follow cultures for abdominal fluid preliminarily showing staph - will add vancomycin to dose per pharm -Surgery continues to follow we appreciate insight and recommendations -WBC down to 13, initially 23 (leukocytosis, febrile, tachypneic overnight -these have now resolved) -T bili now within normal limits after IV fluids -Continue IV fluids -Continue Zosyn (start date 03/19/2019) - added vancomycin 4/24 due to prelim cultures -Advance diet as tolerated -Unclear history of colonoscopy. Will likely need one post hospital discharge for further evaluation.  Essential hypertension - Resume home lisinopril/HCTZ-20/25 once able to tolerate p.o. safely  ?  History of obstructive sleep apnea -Denies current use of CPAP; reports history of sleep study in 2017.  History of endometrial cancer stage I - stable, no indication for current treatment Vitamin D deficiency resume home supplementation at discharge Obesity with BMI greater than 30- lengthy discussion at bedside about need for lifestyle and dietary changes  DVT prophylaxis: Subcutaneous heparin Code Status: Full code Family Communication: No one available by phone Disposition Plan: Inpatient - pending clinical improvement and resolution of infection. Consults called: General surgery Admission status: Inpatient admission to MedSurg   Little Ishikawa MD [facility]  If 7PM-7AM, please contact night-coverage [email or web address]  03/21/2019, 3:38 PM

## 2019-03-21 NOTE — Progress Notes (Signed)
Pharmacy Antibiotic Note  Mariah Wright is a 73 y.o. female admitted on 03/19/2019 with abscess.  Pharmacy has been consulted for vancomycin dosing.  LLQ fluid collection found on CT - consistent w/ diverticular abscess s/p L pelvic drain 4/23. Culture growing moderate staph aureus - sensitivities pending. On zosyn since 4/22. WBC 23>13. Tmax/24 hr is 101.2. Scr 1.15.  Plan: Vancomycin 1500 mg IV then 1000 mg IV every 24 hours  Monitor renal fx, cx results, clinical pic, and vanc levels as indication Monitor sensitivities pending on cx  Height: 5\' 4"  (162.6 cm) Weight: 180 lb (81.6 kg) IBW/kg (Calculated) : 54.7  Temp (24hrs), Avg:100.8 F (38.2 C), Min:98.9 F (37.2 C), Max:102.9 F (39.4 C)  Recent Labs  Lab 03/19/19 1535 03/19/19 2314 03/20/19 0353 03/21/19 0128  WBC 23.0* 21.6* 20.6* 13.0*  CREATININE 0.85 1.01* 1.04* 1.15*  LATICACIDVEN 1.7  --   --   --     Estimated Creatinine Clearance: 45.1 mL/min (A) (by C-G formula based on SCr of 1.15 mg/dL (H)).    No Known Allergies  Antimicrobials this admission: Zosyn 4/22 >>  Vanc 4/24 >>   Dose adjustments this admission: N/A  Microbiology results: L pelvis abscess 4/23: mod staph aureus  Thank you for allowing pharmacy to be a part of this patient's care.  Antonietta Jewel, PharmD, [place] [facility] Clinical Pharmacist  Pager: [phone] Phone: [phone] 03/21/2019 4:07 PM

## 2019-03-21 NOTE — Progress Notes (Signed)
IR rounding note via telephone per new regulations. Spoke with Meva, RN.  Patient with LLQ fluid collection (most likely consistent with diverticular abscess) s/p left pelvic drain placement 03/20/2019 by Dr. Kathlene Cote.  RN reports left pelvic drain site c/d/i with approximately 10 cc of clear, light tan fluid with debris in JP drain.  Continue with current drain management- continue with Qshift flushes/monitor of output. Appreciate and agree with CCS and TRH management. IR to follow.  Bea Graff Louk, PA-C 03/21/2019, 9:23 AM

## 2019-03-21 NOTE — Plan of Care (Signed)

## 2019-03-21 NOTE — Progress Notes (Signed)
Subjective/Chief Complaint: No abdominal pain Asking why she is on insulin   Objective: Vital signs in last 24 hours: Temp:  [98.3 F (36.8 C)-102.9 F (39.4 C)] 101.2 F (38.4 C) (04/24 0649) Pulse Rate:  [78-93] 83 (04/24 0545) Resp:  [16-20] 18 (04/24 0545) BP: (105-123)/(6-68) 105/54 (04/24 0545) SpO2:  [94 %-100 %] 97 % (04/24 0545) Last BM Date: 03/18/19  Intake/Output from previous day: 04/23 0701 - 04/24 0700 In: 1666 [P.O.:180; I.V.:1375.1; IV Piggyback:80.9] Out: 16 [Urine:1; Drains:15] Intake/Output this shift: Total I/O In: 240 [P.O.:240] Out: -   General appearance: alert and cooperative Resp: clear to auscultation bilaterally Cardio: regular rate and rhythm GI: soft, nontender, JP with mostly clear fluid  Lab Results:  Recent Labs    03/20/19 0353 03/21/19 0128  WBC 20.6* 13.0*  HGB 12.2 11.3*  HCT 36.9 35.3*  PLT 239 218   BMET Recent Labs    03/20/19 0353 03/21/19 0128  NA 137 139  K 3.3* 3.6  CL 99 102  CO2 27 25  GLUCOSE 147* 127*  BUN 8 12  CREATININE 1.04* 1.15*  CALCIUM 9.1 8.9   PT/INR Recent Labs    03/20/19 0721  LABPROT 16.7*  INR 1.4*   ABG No results for input(s): PHART, HCO3 in the last 72 hours.  Invalid input(s): PCO2, PO2  Studies/Results: Ct Abdomen Pelvis W Contrast  Result Date: 03/19/2019 CLINICAL DATA:  Febrile, not feeling well EXAM: CT ABDOMEN AND PELVIS WITH CONTRAST TECHNIQUE: Multidetector CT imaging of the abdomen and pelvis was performed using the standard protocol following bolus administration of intravenous contrast. CONTRAST:  119mL OMNIPAQUE IOHEXOL 300 MG/ML  SOLN COMPARISON:  None. FINDINGS: Lower chest: Lung bases show no acute consolidation or effusion. Normal heart size Hepatobiliary: 2.3 cm cyst within the anterior left hepatic lobe. Status post cholecystectomy. Pancreas: Unremarkable. No pancreatic ductal dilatation or surrounding inflammatory changes. Spleen: Normal in size without  focal abnormality. Adrenals/Urinary Tract: Adrenal glands are unremarkable. Kidneys are normal, without renal calculi, focal lesion, or hydronephrosis. Bladder is slightly thick walled with mild surrounding inflammatory changes. Stomach/Bowel: The stomach is unremarkable. No dilated small bowel. Mild segmental thickening involving the sigmoid colon. Vascular/Lymphatic: Nonaneurysmal aorta. Mild aortic atherosclerosis. No significant adenopathy. Reproductive: Status post hysterectomy. Other: No free air. No significant free fluid. Generalized inflammation within the pelvis. Rim enhancing fluid collection within the left pelvis measuring 6.3 x 5.8 cm, abutting the slightly thickened sigmoid colon. Musculoskeletal: No acute or suspicious abnormality IMPRESSION: 1. 6.3 x 5.8 cm mildly rim enhancing fluid collection within the left pelvis, suspect for infected or inflammatory fluid collection. This abuts the sigmoid colon which appears mildly thickened. Findings could be secondary to contained perforation and subsequent abscess formation, possibly due to diverticulitis although not much diverticular changes are seen in the region. No definitive findings for a colon mass. Alternatively, could consider inflammatory or infected adnexal mass. 2. Generalized edema and inflammation within the pelvis. 3. Slightly thick-walled appearance of the bladder with adjacent edema, which may reflect cystitis or reactive changes from the left pelvic rim enhancing collection. Electronically Signed   By: Donavan Foil M.D.   On: 03/19/2019 20:03   Dg Chest Port 1 View  Result Date: 03/19/2019 CLINICAL DATA:  73 year old female with feeling weak EXAM: PORTABLE CHEST 1 VIEW COMPARISON:  None. FINDINGS: The heart size and mediastinal contours are within normal limits. Both lungs are clear. The visualized skeletal structures are unremarkable. IMPRESSION: Negative for acute cardiopulmonary disease Electronically Signed  By: Corrie Mckusick  D.O.   On: 03/19/2019 16:01   Ct Image Guided Drainage By Percutaneous Catheter  Result Date: 03/20/2019 CLINICAL DATA:  Left lower quadrant fluid collection felt by imaging to be most likely consistent with diverticular abscess and leukocytosis. EXAM: CT GUIDED CATHETER DRAINAGE OF PERITONEAL LEFT LOWER QUADRANT ABSCESS ANESTHESIA/SEDATION: 2.0 mg IV Versed 100 mcg IV Fentanyl Total Moderate Sedation Time:  24 minutes The patient's level of consciousness and physiologic status were continuously monitored during the procedure by Radiology nursing. PROCEDURE: The procedure, risks, benefits, and alternatives were explained to the patient. Questions regarding the procedure were encouraged and answered. The patient understands and consents to the procedure. A time out was performed prior to initiating the procedure. CT was performed through the lower abdomen and pelvis in a supine position. The left lower abdominal wall was prepped with chlorhexidine in a sterile fashion, and a sterile drape was applied covering the operative field. A sterile gown and sterile gloves were used for the procedure. Local anesthesia was provided with 1% Lidocaine. Under CT guidance, an 18 gauge trocar needle was advanced to the level of a left lower quadrant fluid collection. After confirming needle tip position, aspiration of fluid was performed. A guidewire was advanced into the collection. The tract was dilated and a 12 French percutaneous drain placed. Drainage catheter position was confirmed by CT. The catheter was attached to a suction bulb. Fluid samples were sent for culture analysis as well as cytology. The catheter was secured at the skin with a Prolene retention suture and StatLock device. COMPLICATIONS: None FINDINGS: Aspiration and additional fluid removal through a percutaneous drain revealed clear, yellowish fluid which does not appear infected. This does not appear to be consistent with a diverticular abscess. Given  nature of fluid, the fluid was also sent for cytologic analysis in addition to routine culture studies. After placement of the drain and some fluid aspiration, CT demonstrates significant decompression of the fluid collection. IMPRESSION: CT-guided percutaneous catheter drainage of left lower quadrant pelvic fluid collection yielding clear, yellow fluid. Fluid samples were sent for culture analysis as well as cytologic analysis. Electronically Signed   By: Aletta Edouard M.D.   On: 03/20/2019 13:54    Anti-infectives: Anti-infectives (From admission, onward)   Start     Dose/Rate Route Frequency Ordered Stop   03/20/19 0600  piperacillin-tazobactam (ZOSYN) IVPB 3.375 g     3.375 g 12.5 mL/hr over 240 Minutes Intravenous Every 8 hours 03/20/19 0212     03/19/19 2030  piperacillin-tazobactam (ZOSYN) IVPB 3.375 g     3.375 g 100 mL/hr over 30 Minutes Intravenous  Once 03/19/19 2020 03/19/19 2140      Assessment/Plan: Diverticulitis and abscess - CT 4/22: 6.3 x 5.8 cm mildly rim enhancing fluid collection within the left pelvis - WBC down to 13 - S/P IR drain 4/23, fluid mostly clear, studies including cytology pending - Advance to fulls  LOS: 2 days    Zenovia Jarred 03/21/2019

## 2019-03-22 DIAGNOSIS — K5720 Diverticulitis of large intestine with perforation and abscess without bleeding: Secondary | ICD-10-CM

## 2019-03-22 LAB — BASIC METABOLIC PANEL
Anion gap: 11 (ref 5–15)
BUN: 9 mg/dL (ref 8–23)
CO2: 24 mmol/L (ref 22–32)
Calcium: 8.6 mg/dL — ABNORMAL LOW (ref 8.9–10.3)
Chloride: 101 mmol/L (ref 98–111)
Creatinine, Ser: 0.91 mg/dL (ref 0.44–1.00)
GFR calc Af Amer: >60 mL/min (ref >60)
GFR calc non Af Amer: >60 mL/min (ref >60)
Glucose, Bld: 102 mg/dL — ABNORMAL HIGH (ref 70–99)
Potassium: 2.5 mmol/L — CL (ref 3.5–5.1)
Sodium: 136 mmol/L (ref 135–145)

## 2019-03-22 LAB — GLUCOSE, CAPILLARY
Glucose-Capillary: 162 mg/dL — ABNORMAL HIGH (ref 70–99)
Glucose-Capillary: 104 mg/dL — ABNORMAL HIGH (ref 70–99)

## 2019-03-22 LAB — CBC
HCT: 31.9 % — ABNORMAL LOW (ref 36.0–46.0)
Hemoglobin: 10.6 g/dL — ABNORMAL LOW (ref 12.0–15.0)
MCH: 29.2 pg (ref 26.0–34.0)
MCHC: 33.2 g/dL (ref 30.0–36.0)
MCV: 87.9 fL (ref 80.0–100.0)
Platelets: 224 10*3/uL (ref 150–400)
RBC: 3.63 MIL/uL — ABNORMAL LOW (ref 3.87–5.11)
RDW: 14.1 % (ref 11.5–15.5)
WBC: 9.2 10*3/uL (ref 4.0–10.5)
nRBC: 0 % (ref 0.0–0.2)

## 2019-03-22 MED ORDER — METRONIDAZOLE 500 MG PO TABS
500.0000 mg | ORAL_TABLET | Freq: Three times a day (TID) | ORAL | Status: DC
  Administered 2019-03-22: 500 mg via ORAL
  Filled 2019-03-22: qty 1

## 2019-03-22 MED ORDER — POTASSIUM CHLORIDE 20 MEQ PO PACK
40.0000 meq | PACK | Freq: Two times a day (BID) | ORAL | Status: DC
  Administered 2019-03-22: 40 meq via ORAL
  Filled 2019-03-22: qty 2

## 2019-03-22 MED ORDER — POTASSIUM CHLORIDE 10 MEQ/100ML IV SOLN
10.0000 meq | INTRAVENOUS | Status: AC
  Administered 2019-03-22 (×4): 10 meq via INTRAVENOUS
  Filled 2019-03-22 (×4): qty 100

## 2019-03-22 MED ORDER — METRONIDAZOLE 500 MG PO TABS: 500.0000 mg | ORAL_TABLET | Freq: Three times a day (TID) | ORAL | 0 refills | Status: AC

## 2019-03-22 MED ORDER — AMOXICILLIN-POT CLAVULANATE 875-125 MG PO TABS: 1.0000 | ORAL_TABLET | Freq: Two times a day (BID) | ORAL | Status: DC

## 2019-03-22 MED ORDER — CIPROFLOXACIN HCL 500 MG PO TABS: 500.0000 mg | ORAL_TABLET | Freq: Two times a day (BID) | ORAL | 0 refills | Status: AC

## 2019-03-22 MED ORDER — CIPROFLOXACIN HCL 500 MG PO TABS
500.0000 mg | ORAL_TABLET | Freq: Two times a day (BID) | ORAL | Status: DC
  Administered 2019-03-22: 500 mg via ORAL
  Filled 2019-03-22: qty 1

## 2019-03-22 NOTE — Discharge Summary (Signed)
History and Physical    Mariah Wright KXF:[id number] DOB: 01-07-1946 DOA: 03/19/2019  PCP: Shon Baton, MD Patient coming from: Home  Chief Complaint: Abdominal discomfort  HPI: Mariah Wright is a 73 y.o. female with medical history significant of prior history of essential hypertension, endometrial cancer, obstructive sleep apnea with obesity, cholecystectomy, tonsillectomy came to the hospital with complains of fatigue and not feeling well.  Patient states for the past few days she has been feeling " sick" but unable to describe me the sickness.  Went to Dr. Virgina Jock who performed routine examination and asked to hydrate herself.  But at home started having subjective fevers with feeling of weakness therefore came to the hospital for evaluation.  History is somewhat limited and confusing as at times she is a poor historian. In the ER she was noted to have elevated WBC greater than 20 and CT abdomen pelvis showing 6.3X 5.8 centimeter fluid collection in the left pelvis concerning for contained perforation with abscess formation and diverticulitis.  Seen by general surgery who recommended IR drainage and antibiotics.  Patient admitted as above with notable abdominal fluid collection concerning for abscess - draining clear yellow fluid which was unexpected as this was presumed to be infectious pocket of concurrent diverticulitis. Cultures positive for MSSA - de-escalate from vanc/zosyn to cipro/flagyl per discussion with Gen Sx - for total of 10 day course. Mild hypokalemia overnight - repleted appropriately. Patient otherwise appears well and is agreeable for DC home. Patient will need follow up with surgery for JP drain evaluation and removal per their expertise. Otherwise follow up with PCP as scheduled - would benefit from having CBC/BMP drawn in the next week to ensure no ongoing leukocytosis or renal impairment, hypokalemia.  Subjective: No acute issues or events overnight - feels back to baseline - no  fevers, chills, cough, sputum production, pain, nausea, vomiting, diarrhea or constipation.  Past Medical History:  Diagnosis Date   Acute diverticulitis 03/19/2019   Bilateral knee pain    Endometrial cancer ([place])    Hypertension    Lipoma    RUQ   Obesity     Past Surgical History:  Procedure Laterality Date   CATARACT EXTRACTION     CHOLECYSTECTOMY  2014   TOTAL ABDOMINAL HYSTERECTOMY      SOCIAL HISTORY:  reports that she has quit smoking. She has never used smokeless tobacco. She reports that she does not drink alcohol or use drugs.  No Known Allergies  FAMILY HISTORY: Family History  Adopted: Yes  Family history unknown: Yes     Prior to Admission medications   Medication Sig Start Date End Date Taking? Authorizing Provider  aspirin 81 MG tablet Take 81 mg by mouth daily.    Yes [provider]  Cholecalciferol (VITAMIN D-3 PO) Take 1 capsule by mouth daily with breakfast.   Yes [provider]  Docusate Calcium (STOOL SOFTENER PO) Take 1 capsule by mouth daily with breakfast.   Yes [provider]  Glucosamine HCl (GLUCOSAMINE PO) Take 2 tablets by mouth daily.   Yes [provider]  lisinopril-hydrochlorothiazide (ZESTORETIC) 20-25 MG tablet Take 1 tablet by mouth daily.   Yes [provider]  vitamin C (ASCORBIC ACID) 500 MG tablet Take 1,000 mg by mouth daily.   Yes [provider]  Multiple Vitamins-Minerals (MULTI FOR HER PO) Take 1 tablet by mouth daily.    [provider]    Physical Exam: Vitals:   03/21/19 1311 03/21/19 1354  03/21/19 1926 03/22/19 0520  BP:  112/75 117/61 107/64  Pulse:  79 83 74  Resp:  18    Temp: 98.9 F (37.2 C) 99.5 F (37.5 C) 99.9 F (37.7 C) 98.8 F (37.1 C)  TempSrc: Oral Oral Oral Oral  SpO2:  97% 97% 97%  Weight:      Height:        General:  Pleasantly resting in bed, No acute distress. HEENT:  Normocephalic atraumatic.  Sclerae nonicteric,  noninjected.  Extraocular movements intact bilaterally. Neck:  Without mass or deformity.  Trachea is midline. Lungs:  Clear to auscultate bilaterally without rhonchi, wheeze, or rales. Heart:  Regular rate and rhythm.  Without murmurs, rubs, or gallops. Abdomen:  Soft, nontender, nondistended.  Without guarding or rebound.  Left lower quadrant JP drain intact draining serous fluid, moderate leakage and bandage soaked through. Extremities: Without cyanosis, clubbing, edema, or obvious deformity. Vascular:  Dorsalis pedis and posterior tibial pulses palpable bilaterally. Skin:  Warm and dry, no erythema, no ulcerations.     Labs on Admission: I have personally reviewed following labs and imaging studies  CBC: Recent Labs  Lab 03/19/19 1535 03/19/19 2314 03/20/19 0353 03/21/19 0128 03/22/19 0302  WBC 23.0* 21.6* 20.6* 13.0* 9.2  NEUTROABS 20.7*  --   --   --   --   HGB 12.5 12.3 12.2 11.3* 10.6*  HCT 37.9 37.2 36.9 35.3* 31.9*  MCV 89.0 88.2 88.7 89.8 87.9  PLT 246 241 239 218 503   Basic Metabolic Panel: Recent Labs  Lab 03/19/19 1535 03/19/19 2314 03/20/19 0353 03/21/19 0128 03/22/19 0302  NA 134* 134* 137 139 136  K 3.4* 3.3* 3.3* 3.6 2.5*  CL 97* 97* 99 102 101  CO2 24 27 27 25 24   GLUCOSE 157* 157* 147* 127* 102*  BUN 12 10 8 12 9   CREATININE 0.85 1.01* 1.04* 1.15* 0.91  CALCIUM 9.2 9.2 9.1 8.9 8.6*  MG  --  1.8  --   --   --    GFR: Estimated Creatinine Clearance: 56.9 mL/min (by C-G formula based on SCr of 0.91 mg/dL). Liver Function Tests: Recent Labs  Lab 03/19/19 1535 03/19/19 2314 03/20/19 0353  AST 38 26 24  ALT 46* 43 39  ALKPHOS 60 59 69  BILITOT 1.9* 2.1* 1.8*  PROT 6.5 6.8 6.4*  ALBUMIN 3.4* 3.2* 3.1*   No results for input(s): LIPASE, AMYLASE in the last 168 hours. No results for input(s): AMMONIA in the last 168 hours. Coagulation Profile: Recent Labs  Lab 03/20/19 0721  INR 1.4*   Cardiac Enzymes: No results for input(s):  CKTOTAL, CKMB, CKMBINDEX, TROPONINI in the last 168 hours. BNP (last 3 results) No results for input(s): PROBNP in the last 8760 hours. HbA1C: No results for input(s): HGBA1C in the last 72 hours. CBG: Recent Labs  Lab 03/21/19 0754 03/21/19 1129 03/21/19 1648 03/21/19 2120 03/22/19 0819  GLUCAP 132* 106* 124* 92 162*   Lipid Profile: No results for input(s): CHOL, HDL, LDLCALC, TRIG, CHOLHDL, LDLDIRECT in the last 72 hours. Thyroid Function Tests: No results for input(s): TSH, T4TOTAL, FREET4, T3FREE, THYROIDAB in the last 72 hours. Anemia Panel: No results for input(s): VITAMINB12, FOLATE, FERRITIN, TIBC, IRON, RETICCTPCT in the last 72 hours. Urine analysis:    Component Value Date/Time   COLORURINE YELLOW 03/19/2019 1745   APPEARANCEUR TURBID (A) 03/19/2019 1745   LABSPEC 1.025 03/19/2019 1745   PHURINE 5.0 03/19/2019 1745   GLUCOSEU NEGATIVE 03/19/2019 1745  HGBUR MODERATE (A) 03/19/2019 1745   BILIRUBINUR NEGATIVE 03/19/2019 1745   KETONESUR NEGATIVE 03/19/2019 1745   PROTEINUR 30 (A) 03/19/2019 1745   NITRITE NEGATIVE 03/19/2019 1745   LEUKOCYTESUR NEGATIVE 03/19/2019 1745   Radiological Exams on Admission: Ct Image Guided Drainage By Percutaneous Catheter  Result Date: 03/20/2019 CLINICAL DATA:  Left lower quadrant fluid collection felt by imaging to be most likely consistent with diverticular abscess and leukocytosis. EXAM: CT GUIDED CATHETER DRAINAGE OF PERITONEAL LEFT LOWER QUADRANT ABSCESS ANESTHESIA/SEDATION: 2.0 mg IV Versed 100 mcg IV Fentanyl Total Moderate Sedation Time:  24 minutes The patient's level of consciousness and physiologic status were continuously monitored during the procedure by Radiology nursing. PROCEDURE: The procedure, risks, benefits, and alternatives were explained to the patient. Questions regarding the procedure were encouraged and answered. The patient understands and consents to the procedure. A time out was performed prior to  initiating the procedure. CT was performed through the lower abdomen and pelvis in a supine position. The left lower abdominal wall was prepped with chlorhexidine in a sterile fashion, and a sterile drape was applied covering the operative field. A sterile gown and sterile gloves were used for the procedure. Local anesthesia was provided with 1% Lidocaine. Under CT guidance, an 18 gauge trocar needle was advanced to the level of a left lower quadrant fluid collection. After confirming needle tip position, aspiration of fluid was performed. A guidewire was advanced into the collection. The tract was dilated and a 12 French percutaneous drain placed. Drainage catheter position was confirmed by CT. The catheter was attached to a suction bulb. Fluid samples were sent for culture analysis as well as cytology. The catheter was secured at the skin with a Prolene retention suture and StatLock device. COMPLICATIONS: None FINDINGS: Aspiration and additional fluid removal through a percutaneous drain revealed clear, yellowish fluid which does not appear infected. This does not appear to be consistent with a diverticular abscess. Given nature of fluid, the fluid was also sent for cytologic analysis in addition to routine culture studies. After placement of the drain and some fluid aspiration, CT demonstrates significant decompression of the fluid collection. IMPRESSION: CT-guided percutaneous catheter drainage of left lower quadrant pelvic fluid collection yielding clear, yellow fluid. Fluid samples were sent for culture analysis as well as cytologic analysis. Electronically Signed   By: Aletta Edouard M.D.   On: 03/20/2019 13:54     All images have been reviewed by me personally.  EKG: Independently reviewed.   Assessment/Plan Principal Problem:   Acute diverticulitis Active Problems:   Benign essential HTN   Endometrial cancer (H[facility])   OSA (obstructive sleep apnea)   Sepsis ([place])   Pericolonic abscess due to  diverticulitis   Sepsis in the setting of acute diverticulitis with abscess formation and likely contained perforation, POA, improving -Status post drainage with IR, tolerated the procedure well, JP drain currently draining clear to serosanguineous fluid - follow with surgery for removal/managment -Cultures positive for MSSA - de-escalate to cipro/flagyl for coverage of abdomen and fluid culture -Labs improving other than hypokalemia overnight - repleted appropriately  -Unclear history of colonoscopy. Will likely need one post hospital discharge for further evaluation.  Essential hypertension - Resume home lisinopril/HCTZ-20/25  Hypokalemia -Repleted - follow with outpatient labs.  ?  History of obstructive sleep apnea -Denies current use of CPAP; reports history of sleep study in 2017.  History of endometrial cancer stage I - stable, no indication for current treatment Vitamin D deficiency resume home supplementation at  discharge Obesity with BMI greater than 30- lengthy discussion at bedside about need for lifestyle and dietary changes  DVT prophylaxis: Subcutaneous heparin Code Status: Full code Family Communication: No one available by phone Disposition Plan: Discharge home per discussion with surgery Consults called: General surgery Admission status: Inpatient admission to MedSurg   Little Ishikawa MD [facility]  If 7PM-7AM, please contact night-coverage [email or web address]  03/22/2019, 12:01 PM

## 2019-03-22 NOTE — Progress Notes (Signed)
CRITICAL VALUE ALERT  Critical Value:  Potassium 2.5  Date & Time Notied:  04/25 : 7129  Provider Notified: [facility]- Bodenheimer   Orders Received/Actions taken: awaiting orders

## 2019-03-22 NOTE — Progress Notes (Signed)
  Progress Note: General Surgery Service   Assessment/Plan: Principal Problem:   Acute diverticulitis Active Problems:   Benign essential HTN   Endometrial cancer (HCC)   OSA (obstructive sleep apnea)   Sepsis ([place])  Diverticulitis and abscess - CT 4/22: 6.3 x 5.8 cm mildly rim enhancing fluid collection within the left pelvis - WBC down to 13 - S/P IR drain 4/23, fluid mostly clear, studies including cytology pending - abscess with staph -ok to discharge home with 10 days cipro/flagyl or doxycycline/flagyl   LOS: 3 days  Chief Complaint/Subjective: No abdominal pain, tolerating full liquids, wants to go home  Objective: Vital signs in last 24 hours: Temp:  [98.8 F (37.1 C)-99.9 F (37.7 C)] 98.8 F (37.1 C) (04/25 0520) Pulse Rate:  [74-83] 74 (04/25 0520) Resp:  [18] 18 (04/24 1354) BP: (107-117)/(61-75) 107/64 (04/25 0520) SpO2:  [97 %] 97 % (04/25 0520) Last BM Date: 03/21/19  Intake/Output from previous day: 04/24 0701 - 04/25 0700 In: 600 [P.O.:600] Out: 20 [Drains:20] Intake/Output this shift: Total I/O In: 480 [P.O.:480] Out: -   Lungs: nonlabored  Cardiovascular: RRR  Abd: soft, NT, ND, drain with clear yellow fluid  Extremities: no edema  Neuro: AOx4  Lab Results: CBC  Recent Labs    03/21/19 0128 03/22/19 0302  WBC 13.0* 9.2  HGB 11.3* 10.6*  HCT 35.3* 31.9*  PLT 218 224   BMET Recent Labs    03/21/19 0128 03/22/19 0302  NA 139 136  K 3.6 2.5*  CL 102 101  CO2 25 24  GLUCOSE 127* 102*  BUN 12 9  CREATININE 1.15* 0.91  CALCIUM 8.9 8.6*   PT/INR Recent Labs    03/20/19 0721  LABPROT 16.7*  INR 1.4*   ABG No results for input(s): PHART, HCO3 in the last 72 hours.  Invalid input(s): PCO2, PO2  Studies/Results:  Anti-infectives: Anti-infectives (From admission, onward)   Start     Dose/Rate Route Frequency Ordered Stop   03/22/19 1700  vancomycin (VANCOCIN) IVPB 1000 mg/200 mL premix     1,000 mg 200 mL/hr over  60 Minutes Intravenous Every 24 hours 03/21/19 1606     03/21/19 1615  vancomycin (VANCOCIN) 1,500 mg in sodium chloride 0.9 % 500 mL IVPB     1,500 mg 250 mL/hr over 120 Minutes Intravenous  Once 03/21/19 1606 03/21/19 1800   03/20/19 0600  piperacillin-tazobactam (ZOSYN) IVPB 3.375 g     3.375 g 12.5 mL/hr over 240 Minutes Intravenous Every 8 hours 03/20/19 0212     03/19/19 2030  piperacillin-tazobactam (ZOSYN) IVPB 3.375 g     3.375 g 100 mL/hr over 30 Minutes Intravenous  Once 03/19/19 2020 03/19/19 2140      Medications: Scheduled Meds: . aspirin EC  81 mg Oral Daily  . heparin  5,000 Units Subcutaneous Q8H  . hydrochlorothiazide  25 mg Oral Daily  . insulin aspart  0-9 Units Subcutaneous TID WC  . lisinopril  20 mg Oral Daily  . sodium chloride flush  5 mL Intracatheter Q12H  . vitamin C  1,000 mg Oral Daily   Continuous Infusions: . sodium chloride 75 mL/hr at 03/21/19 0534  . piperacillin-tazobactam (ZOSYN)  IV 3.375 g (03/21/19 2311)  . potassium chloride 10 mEq (03/22/19 0859)  . vancomycin     PRN Meds:.acetaminophen **OR** acetaminophen, bisacodyl, senna-docusate  Mickeal Skinner, MD [facility]

## 2019-03-22 NOTE — Progress Notes (Signed)
Discharge paperwork and instructions given to pt. Pt not in distress and tolerated well. 

## 2019-03-22 NOTE — Plan of Care (Signed)
  Problem: Coping: Goal: Level of anxiety will decrease Outcome: Progressing   Problem: Pain Managment: Goal: General experience of comfort will improve Outcome: Progressing   Problem: Safety: Goal: Ability to remain free from injury will improve Outcome: Progressing   Problem: Skin Integrity: Goal: Risk for impaired skin integrity will decrease Outcome: Progressing   

## 2019-03-23 ENCOUNTER — Other Ambulatory Visit: Payer: Self-pay | Admitting: Physician Assistant

## 2019-03-23 DIAGNOSIS — K651 Peritoneal abscess: Secondary | ICD-10-CM

## 2019-03-24 ENCOUNTER — Other Ambulatory Visit: Payer: Self-pay | Admitting: General Surgery

## 2019-03-24 ENCOUNTER — Other Ambulatory Visit (HOSPITAL_COMMUNITY): Payer: Self-pay | Admitting: Diagnostic Radiology

## 2019-03-24 DIAGNOSIS — K651 Peritoneal abscess: Secondary | ICD-10-CM

## 2019-03-25 ENCOUNTER — Other Ambulatory Visit: Payer: Self-pay

## 2019-03-25 ENCOUNTER — Ambulatory Visit (HOSPITAL_COMMUNITY)
Admission: RE | Admit: 2019-03-25 | Discharge: 2019-03-25 | Disposition: A | Discharge status: Home / Self Care | Payer: Medicare Other | Source: Ambulatory Visit | Attending: Diagnostic Radiology | Admitting: Diagnostic Radiology

## 2019-03-25 ENCOUNTER — Encounter (HOSPITAL_COMMUNITY): Payer: Self-pay | Admitting: Diagnostic Radiology

## 2019-03-25 DIAGNOSIS — R739 Hyperglycemia, unspecified: Secondary | ICD-10-CM | POA: Diagnosis not present

## 2019-03-25 DIAGNOSIS — Z4889 Encounter for other specified surgical aftercare: Secondary | ICD-10-CM | POA: Diagnosis not present

## 2019-03-25 DIAGNOSIS — K5720 Diverticulitis of large intestine with perforation and abscess without bleeding: Secondary | ICD-10-CM | POA: Diagnosis not present

## 2019-03-25 DIAGNOSIS — F418 Other specified anxiety disorders: Secondary | ICD-10-CM | POA: Diagnosis not present

## 2019-03-25 DIAGNOSIS — R319 Hematuria, unspecified: Secondary | ICD-10-CM | POA: Diagnosis not present

## 2019-03-25 DIAGNOSIS — M1990 Unspecified osteoarthritis, unspecified site: Secondary | ICD-10-CM | POA: Diagnosis not present

## 2019-03-25 DIAGNOSIS — G4700 Insomnia, unspecified: Secondary | ICD-10-CM | POA: Diagnosis not present

## 2019-03-25 DIAGNOSIS — D649 Anemia, unspecified: Secondary | ICD-10-CM | POA: Diagnosis not present

## 2019-03-25 DIAGNOSIS — K651 Peritoneal abscess: Secondary | ICD-10-CM | POA: Insufficient documentation

## 2019-03-25 DIAGNOSIS — R351 Nocturia: Secondary | ICD-10-CM | POA: Diagnosis not present

## 2019-03-25 DIAGNOSIS — R197 Diarrhea, unspecified: Secondary | ICD-10-CM | POA: Diagnosis not present

## 2019-03-25 HISTORY — PX: IR SINUS/FIST TUBE CHK-NON GI: IMG673

## 2019-03-25 LAB — AEROBIC/ANAEROBIC CULTURE W GRAM STAIN (SURGICAL/DEEP WOUND)
Gram Stain: NONE SEEN
Special Requests: NORMAL

## 2019-03-25 MED ORDER — LIDOCAINE HCL 1 % IJ SOLN
INTRAMUSCULAR | Status: AC
  Filled 2019-03-25: qty 20

## 2019-03-25 MED ORDER — IOHEXOL 300 MG/ML  SOLN
50.0000 mL | Freq: Once | INTRAMUSCULAR | Status: AC | PRN
  Administered 2019-03-25: 11:00:00 10 mL

## 2019-03-25 NOTE — Progress Notes (Signed)
Referring Physician(s): Dr Jonny Ruiz  Supervising Physician: Markus Daft  Patient Status:  [facility] OP  Chief Complaint:  Diverticular abscess drain placed 4/23 Leaking at site  Subjective:  Drain placed 4/23 Pt states this drain has leaked from site since placement-- until today (none today) She has taken care of drain but not sure she is flushing in correct place  OP is minimal Serous OP in JP  Scheduled now for drain injection and possible exchange   Allergies: Patient has no known allergies.  Medications: Prior to Admission medications   Medication Sig Start Date End Date Taking? Authorizing Provider  aspirin 81 MG tablet Take 81 mg by mouth daily.     [provider]  Cholecalciferol (VITAMIN D-3 PO) Take 1 capsule by mouth daily with breakfast.    [provider]  ciprofloxacin (CIPRO) 500 MG tablet Take 1 tablet (500 mg total) by mouth 2 (two) times daily for 8 days. 03/22/19 03/30/19  Little Ishikawa, MD  Docusate Calcium (STOOL SOFTENER PO) Take 1 capsule by mouth daily with breakfast.    [provider]  Glucosamine HCl (GLUCOSAMINE PO) Take 2 tablets by mouth daily.    [provider]  lisinopril-hydrochlorothiazide (ZESTORETIC) 20-25 MG tablet Take 1 tablet by mouth daily.    [provider]  metroNIDAZOLE (FLAGYL) 500 MG tablet Take 1 tablet (500 mg total) by mouth every 8 (eight) hours for 8 days. 03/22/19 03/30/19  Little Ishikawa, MD  Multiple Vitamins-Minerals (MULTI FOR HER PO) Take 1 tablet by mouth daily.    [provider]  vitamin C (ASCORBIC ACID) 500 MG tablet Take 1,000 mg by mouth daily.    [provider]     Physical Exam Skin:    General: Skin is warm and dry.     Comments: Site is clean and dry NT No bleeding No sign of infection  OP in JP is scan Serous color     Imaging: No results found.  Labs:  CBC: Recent Labs    03/19/19 2314 03/20/19 0353 03/21/19 0128  03/22/19 0302  WBC 21.6* 20.6* 13.0* 9.2  HGB 12.3 12.2 11.3* 10.6*  HCT 37.2 36.9 35.3* 31.9*  PLT 241 239 218 224    COAGS: Recent Labs    03/20/19 0721  INR 1.4*    BMP: Recent Labs    03/19/19 2314 03/20/19 0353 03/21/19 0128 03/22/19 0302  NA 134* 137 139 136  K 3.3* 3.3* 3.6 2.5*  CL 97* 99 102 101  CO2 27 27 25 24   GLUCOSE 157* 147* 127* 102*  BUN 10 8 12 9   CALCIUM 9.2 9.1 8.9 8.6*  CREATININE 1.01* 1.04* 1.15* 0.91  GFRNONAA 55* 53* 47* >60  GFRAA >60 >60 55* >60    LIVER FUNCTION TESTS: Recent Labs    03/19/19 1535 03/19/19 2314 03/20/19 0353  BILITOT 1.9* 2.1* 1.8*  AST 38 26 24  ALT 46* 43 39  ALKPHOS 60 59 69  PROT 6.5 6.8 6.4*  ALBUMIN 3.4* 3.2* 3.1*    Assessment and Plan:  Diverticular abscess drain  Leaking per pt Scheduled now for evaluation- injection and poss exchange Pt is aware of procedure benefits and risks including but not limited to Infection; bleeding Agreeable to proceed Consent signed in chart  Electronically Signed: Lavonia Drafts, PA-C 03/25/2019, 10:57 AM   I spent a total of 15 Minutes at the the patient's bedside AND on the patient's hospital floor or unit, greater  than 50% of which was counseling/coordinating care for diverticular abscess drain

## 2019-03-28 DIAGNOSIS — N8502 Endometrial intraepithelial neoplasia [EIN]: Secondary | ICD-10-CM | POA: Diagnosis not present

## 2019-03-28 DIAGNOSIS — I10 Essential (primary) hypertension: Secondary | ICD-10-CM | POA: Diagnosis not present

## 2019-03-28 DIAGNOSIS — R82998 Other abnormal findings in urine: Secondary | ICD-10-CM | POA: Diagnosis not present

## 2019-04-09 ENCOUNTER — Other Ambulatory Visit: Payer: Medicare Other

## 2019-04-09 ENCOUNTER — Ambulatory Visit
Admission: RE | Admit: 2019-04-09 | Discharge: 2019-04-09 | Disposition: A | Discharge status: Home / Self Care | Payer: Medicare Other | Source: Ambulatory Visit | Attending: General Surgery | Admitting: General Surgery

## 2019-04-09 ENCOUNTER — Ambulatory Visit
Admission: RE | Admit: 2019-04-09 | Discharge: 2019-04-09 | Disposition: A | Discharge status: Home / Self Care | Payer: Medicare Other | Source: Ambulatory Visit | Attending: Physician Assistant | Admitting: Physician Assistant

## 2019-04-09 ENCOUNTER — Encounter: Payer: Self-pay | Admitting: Radiology

## 2019-04-09 DIAGNOSIS — K651 Peritoneal abscess: Secondary | ICD-10-CM | POA: Diagnosis not present

## 2019-04-09 HISTORY — PX: IR RADIOLOGIST EVAL & MGMT: IMG5224

## 2019-04-09 MED ORDER — IOPAMIDOL (ISOVUE-300) INJECTION 61%
100.0000 mL | Freq: Once | INTRAVENOUS | Status: AC | PRN
  Administered 2019-04-09: 100 mL via INTRAVENOUS

## 2019-04-09 NOTE — Progress Notes (Signed)
Referring Physician(s): Dr. Nadeen Landau  Chief Complaint: The patient is seen in follow up today s/p intra-abdominal fluid collection with drain placement 03/20/19.  History of present illness: Mariah Wright is a 73 year old female with past medical history of HTN, OSA, cholecystectomy, tonsillectomy, and endometrial cancer who presented to [facility] ED 03/19/19 with abdominal pain.  She was found to have an intra-abdominal fluid collection. IR was requested for lower quadrant drain placement which was performed 03/20/19. She was subsequently transitioned to PO antibiotics and was discharged home with outpatient follow-up. She did return to [facility] for drain injection 03/25/19 after discharge due to leakage around the drain. Drain was found to be good position and was resutured in place. Patient presents to IR drain clinic today in improved condition.  She has been feeling much better at home.  She has completed antibiotics. Denies fever, chills, abdominal pain, nausea, vomiting. She has been flushing the drain twice daily.  Denies additional complaints.   Past Medical History:  Diagnosis Date   Acute diverticulitis 03/19/2019   Bilateral knee pain    Endometrial cancer ([place])    Hypertension    Lipoma    RUQ   Obesity     Past Surgical History:  Procedure Laterality Date   CATARACT EXTRACTION     CHOLECYSTECTOMY  2014   IR SINUS/FIST TUBE CHK-NON GI  03/25/2019   TOTAL ABDOMINAL HYSTERECTOMY      Allergies: Patient has no known allergies.  Medications: Prior to Admission medications   Medication Sig Start Date End Date Taking? Authorizing Provider  aspirin 81 MG tablet Take 81 mg by mouth daily.     [provider]  Cholecalciferol (VITAMIN D-3 PO) Take 1 capsule by mouth daily with breakfast.    [provider]  Docusate Calcium (STOOL SOFTENER PO) Take 1 capsule by mouth daily with breakfast.    [provider]  Glucosamine HCl (GLUCOSAMINE PO) Take  2 tablets by mouth daily.    [provider]  lisinopril-hydrochlorothiazide (ZESTORETIC) 20-25 MG tablet Take 1 tablet by mouth daily.    [provider]  Multiple Vitamins-Minerals (MULTI FOR HER PO) Take 1 tablet by mouth daily.    [provider]  vitamin C (ASCORBIC ACID) 500 MG tablet Take 1,000 mg by mouth daily.    [provider]     Family History  Adopted: Yes  Family history unknown: Yes    Social History   Socioeconomic History   Marital status: Legally Separated    Spouse name: Not on file   Number of children: Not on file   Years of education: Not on file   Highest education level: Not on file  Occupational History   Not on file  Social Needs   Financial resource strain: Not on file   Food insecurity:    Worry: Not on file    Inability: Not on file   Transportation needs:    Medical: Not on file    Non-medical: Not on file  Tobacco Use   Smoking status: Former Smoker   Smokeless tobacco: Never Used  Substance and Sexual Activity   Alcohol use: No   Drug use: No   Sexual activity: Not on file  Lifestyle   Physical activity:    Days per week: Not on file    Minutes per session: Not on file   Stress: Not on file  Relationships   Social connections:    Talks on phone: Not on file  Gets together: Not on file    Attends religious service: Not on file    Active member of club or organization: Not on file    Attends meetings of clubs or organizations: Not on file    Relationship status: Not on file  Other Topics Concern   Not on file  Social History Narrative   Not on file     Vital Signs: There were no vitals taken for this visit.  Physical Exam  NAD, alert Abdomen: soft, non-tender, non-distended.  LLQ drain in place.  Insertion site is clean and dry.  There is a hardening of the skin around the drain with is red, but non-tender. No pus is expressed from around the drain.  No output in  bulb.  Imaging: No results found.  Labs:  CBC: Recent Labs    03/19/19 2314 03/20/19 0353 03/21/19 0128 03/22/19 0302  WBC 21.6* 20.6* 13.0* 9.2  HGB 12.3 12.2 11.3* 10.6*  HCT 37.2 36.9 35.3* 31.9*  PLT 241 239 218 224    COAGS: Recent Labs    03/20/19 0721  INR 1.4*    BMP: Recent Labs    03/19/19 2314 03/20/19 0353 03/21/19 0128 03/22/19 0302  NA 134* 137 139 136  K 3.3* 3.3* 3.6 2.5*  CL 97* 99 102 101  CO2 27 27 25 24   GLUCOSE 157* 147* 127* 102*  BUN 10 8 12 9   CALCIUM 9.2 9.1 8.9 8.6*  CREATININE 1.01* 1.04* 1.15* 0.91  GFRNONAA 55* 53* 47* >60  GFRAA >60 >60 55* >60    LIVER FUNCTION TESTS: Recent Labs    03/19/19 1535 03/19/19 2314 03/20/19 0353  BILITOT 1.9* 2.1* 1.8*  AST 38 26 24  ALT 46* 43 39  ALKPHOS 60 59 69  PROT 6.5 6.8 6.4*  ALBUMIN 3.4* 3.2* 3.1*    Assessment: Intra-abdominal/pelvic fluid collection s/p drain placement 03/20/19 Patient has been doing well at home.  She has not had issues with her drain since injection and evaluation 4/28.  She has completed antibiotics and has scheduled follow-up with surgery next week.  CT Abdomen Pelvis reviewed by Dr. Earleen Newport who notes improvement in collection.  Drain removed at bedside without complication.  Drain site with hardened area of suspected granulation tissue. No evidence of infection at site (no fluctuance, no pus, non-tender).  Patient given care instructions. No further follow-up needed with IR at this time. Patient to contact our office with any future needs.  She is encouraged to keep follow-up appointments with surgery.   Signed: Docia Barrier, PA 04/09/2019, 9:58 AM   Please refer to Dr. Earleen Newport attestation of this note for management and plan.

## 2019-04-14 DIAGNOSIS — K5780 Diverticulitis of intestine, part unspecified, with perforation and abscess without bleeding: Secondary | ICD-10-CM | POA: Diagnosis not present

## 2019-05-19 DIAGNOSIS — I10 Essential (primary) hypertension: Secondary | ICD-10-CM | POA: Diagnosis not present

## 2019-05-19 DIAGNOSIS — K5720 Diverticulitis of large intestine with perforation and abscess without bleeding: Secondary | ICD-10-CM | POA: Diagnosis not present

## 2019-05-19 DIAGNOSIS — D649 Anemia, unspecified: Secondary | ICD-10-CM | POA: Diagnosis not present

## 2019-05-19 DIAGNOSIS — E876 Hypokalemia: Secondary | ICD-10-CM | POA: Diagnosis not present

## 2019-06-04 DIAGNOSIS — E876 Hypokalemia: Secondary | ICD-10-CM | POA: Diagnosis not present

## 2019-06-04 DIAGNOSIS — D649 Anemia, unspecified: Secondary | ICD-10-CM | POA: Diagnosis not present

## 2019-06-10 DIAGNOSIS — E782 Mixed hyperlipidemia: Secondary | ICD-10-CM | POA: Diagnosis not present

## 2019-06-10 DIAGNOSIS — L680 Hirsutism: Secondary | ICD-10-CM | POA: Diagnosis not present

## 2019-06-17 DIAGNOSIS — D649 Anemia, unspecified: Secondary | ICD-10-CM | POA: Diagnosis not present

## 2019-06-17 DIAGNOSIS — L680 Hirsutism: Secondary | ICD-10-CM | POA: Diagnosis not present

## 2019-09-26 DIAGNOSIS — I700 Atherosclerosis of aorta: Secondary | ICD-10-CM | POA: Diagnosis not present

## 2019-09-26 DIAGNOSIS — K5790 Diverticulosis of intestine, part unspecified, without perforation or abscess without bleeding: Secondary | ICD-10-CM | POA: Diagnosis not present

## 2019-09-26 DIAGNOSIS — F418 Other specified anxiety disorders: Secondary | ICD-10-CM | POA: Diagnosis not present

## 2019-09-26 DIAGNOSIS — Z1331 Encounter for screening for depression: Secondary | ICD-10-CM | POA: Diagnosis not present

## 2019-09-26 DIAGNOSIS — D649 Anemia, unspecified: Secondary | ICD-10-CM | POA: Diagnosis not present

## 2019-09-26 DIAGNOSIS — I10 Essential (primary) hypertension: Secondary | ICD-10-CM | POA: Diagnosis not present

## 2019-09-26 DIAGNOSIS — E669 Obesity, unspecified: Secondary | ICD-10-CM | POA: Diagnosis not present

## 2019-10-01 DIAGNOSIS — E782 Mixed hyperlipidemia: Secondary | ICD-10-CM | POA: Diagnosis not present

## 2019-10-01 DIAGNOSIS — R739 Hyperglycemia, unspecified: Secondary | ICD-10-CM | POA: Diagnosis not present

## 2019-12-18 DIAGNOSIS — C541 Malignant neoplasm of endometrium: Secondary | ICD-10-CM | POA: Diagnosis not present

## 2020-03-18 DIAGNOSIS — R739 Hyperglycemia, unspecified: Secondary | ICD-10-CM | POA: Diagnosis not present

## 2020-03-18 DIAGNOSIS — E782 Mixed hyperlipidemia: Secondary | ICD-10-CM | POA: Diagnosis not present

## 2020-03-18 DIAGNOSIS — Z0000 Encounter for general adult medical examination without abnormal findings: Secondary | ICD-10-CM | POA: Diagnosis not present

## 2020-03-25 DIAGNOSIS — I10 Essential (primary) hypertension: Secondary | ICD-10-CM | POA: Diagnosis not present

## 2020-03-25 DIAGNOSIS — K5730 Diverticulosis of large intestine without perforation or abscess without bleeding: Secondary | ICD-10-CM | POA: Diagnosis not present

## 2020-03-25 DIAGNOSIS — R82998 Other abnormal findings in urine: Secondary | ICD-10-CM | POA: Diagnosis not present

## 2020-03-25 DIAGNOSIS — E876 Hypokalemia: Secondary | ICD-10-CM | POA: Diagnosis not present

## 2020-03-25 DIAGNOSIS — M1990 Unspecified osteoarthritis, unspecified site: Secondary | ICD-10-CM | POA: Diagnosis not present

## 2020-03-25 DIAGNOSIS — I700 Atherosclerosis of aorta: Secondary | ICD-10-CM | POA: Diagnosis not present

## 2020-03-25 DIAGNOSIS — Z0000 Encounter for general adult medical examination without abnormal findings: Secondary | ICD-10-CM | POA: Diagnosis not present

## 2020-03-25 DIAGNOSIS — L680 Hirsutism: Secondary | ICD-10-CM | POA: Diagnosis not present

## 2020-03-25 DIAGNOSIS — Z1331 Encounter for screening for depression: Secondary | ICD-10-CM | POA: Diagnosis not present

## 2020-03-25 DIAGNOSIS — M109 Gout, unspecified: Secondary | ICD-10-CM | POA: Diagnosis not present

## 2020-03-25 DIAGNOSIS — D649 Anemia, unspecified: Secondary | ICD-10-CM | POA: Diagnosis not present

## 2020-03-25 DIAGNOSIS — E669 Obesity, unspecified: Secondary | ICD-10-CM | POA: Diagnosis not present

## 2020-03-31 DIAGNOSIS — M109 Gout, unspecified: Secondary | ICD-10-CM | POA: Diagnosis not present

## 2020-04-02 DIAGNOSIS — Z1212 Encounter for screening for malignant neoplasm of rectum: Secondary | ICD-10-CM | POA: Diagnosis not present

## 2020-10-27 DIAGNOSIS — M109 Gout, unspecified: Secondary | ICD-10-CM | POA: Diagnosis not present

## 2020-10-27 DIAGNOSIS — E782 Mixed hyperlipidemia: Secondary | ICD-10-CM | POA: Diagnosis not present

## 2020-12-04 IMAGING — CT CT ABDOMEN AND PELVIS WITH CONTRAST
2 of 5 series · 16 of 46 positions shown, 18 images · IV contrast (omnipaque)
Comparison: None.

CLINICAL DATA: Febrile, not feeling well

EXAM:
CT ABDOMEN AND PELVIS WITH CONTRAST
TECHNIQUE: Multidetector CT imaging of the abdomen and pelvis was performed
using the standard protocol following bolus administration of
intravenous contrast.
CONTRAST:  100mL OMNIPAQUE IOHEXOL 300 MG/ML  SOLN

[Series 3: abdomen 5.0 · axial · 0.89mm/px · z∈[+851,+1271]mm · 13 of 96 slices shown, 15 images]
[im 6/96  soft-tissue]
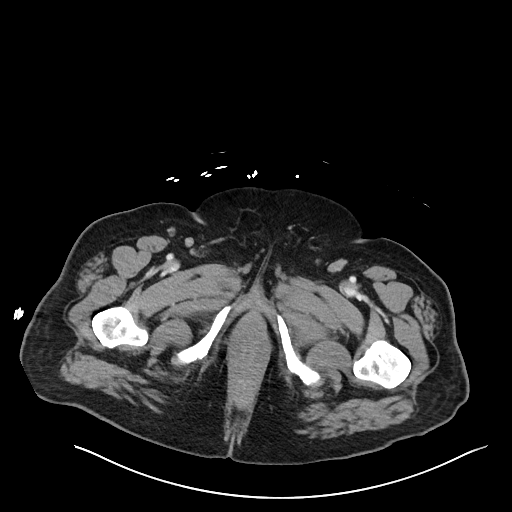
[im 6/96  bone]
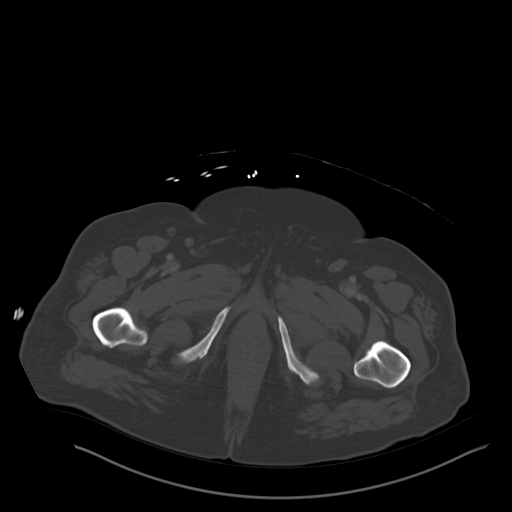
[im 12/96  soft-tissue]
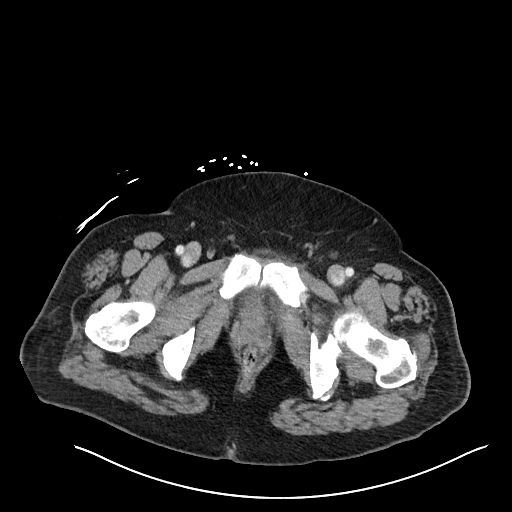
[im 18/96  soft-tissue]
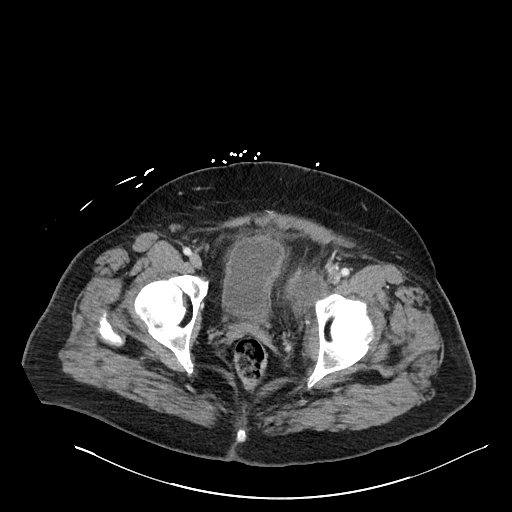
[im 30/96  soft-tissue]
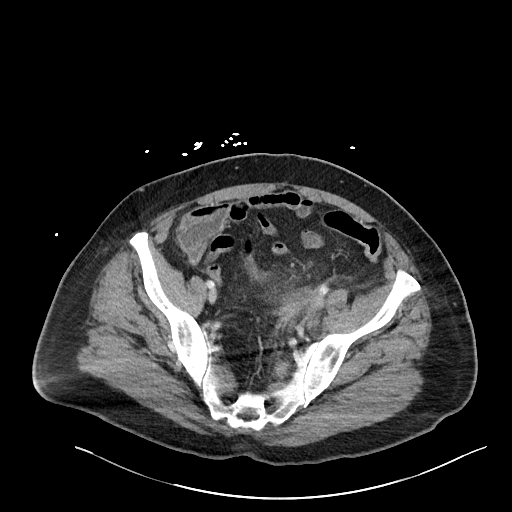
[im 36/96  soft-tissue]
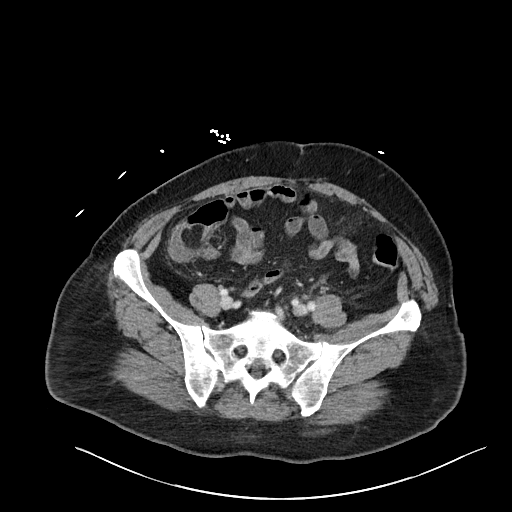
[im 42/96  soft-tissue]
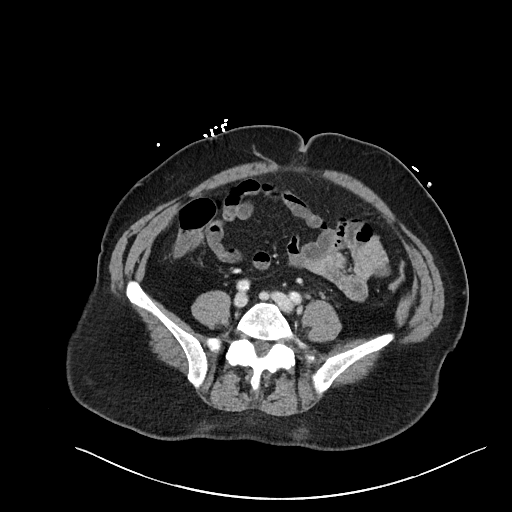
[im 48/96  soft-tissue]
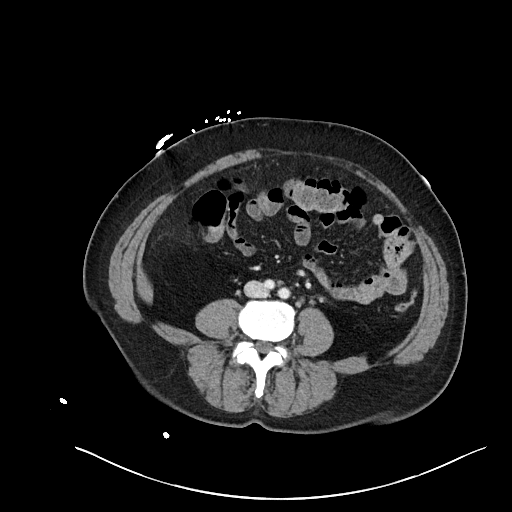
[im 54/96  soft-tissue]
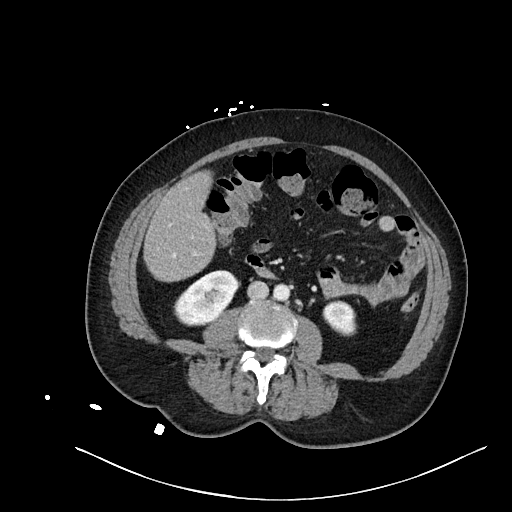
[im 60/96  soft-tissue]
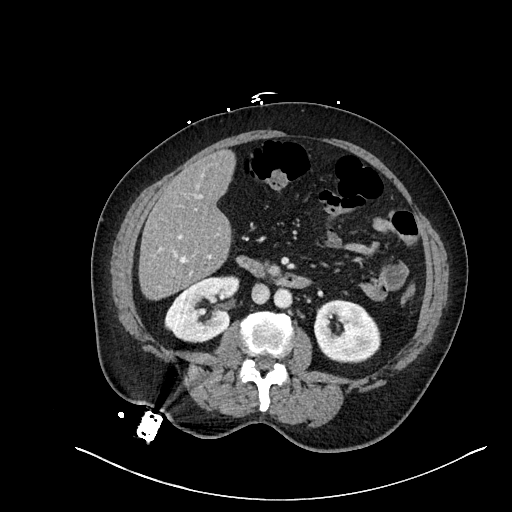
[im 60/96  bone]
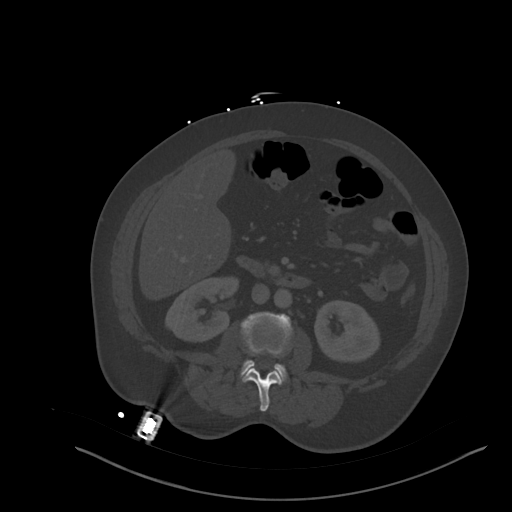
[im 66/96  soft-tissue]
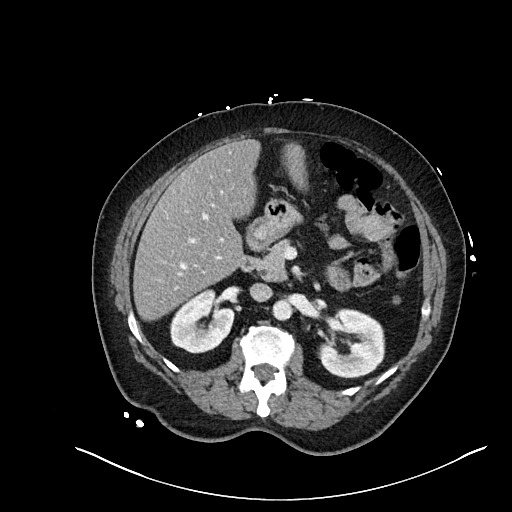
[im 78/96  soft-tissue]
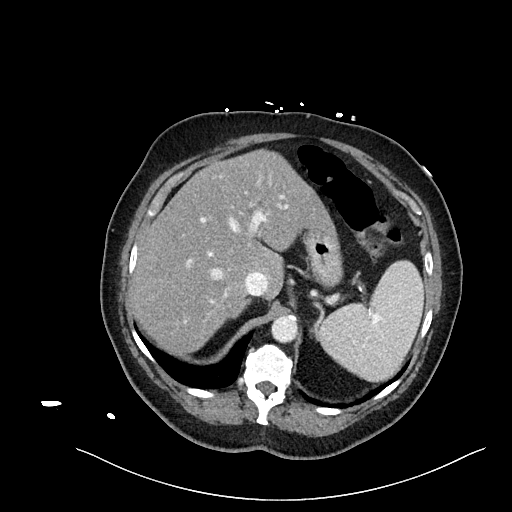
[im 84/96  soft-tissue]
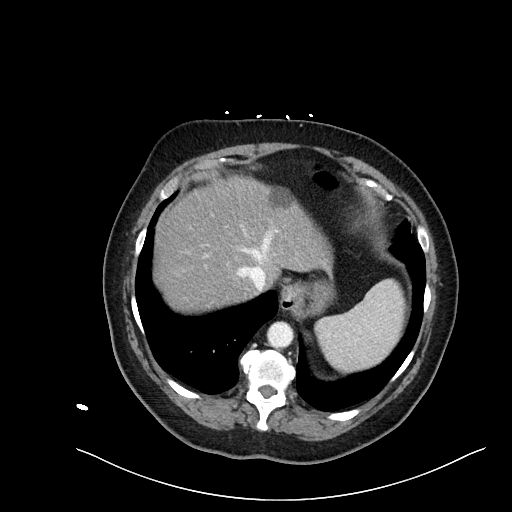
[im 90/96  soft-tissue]
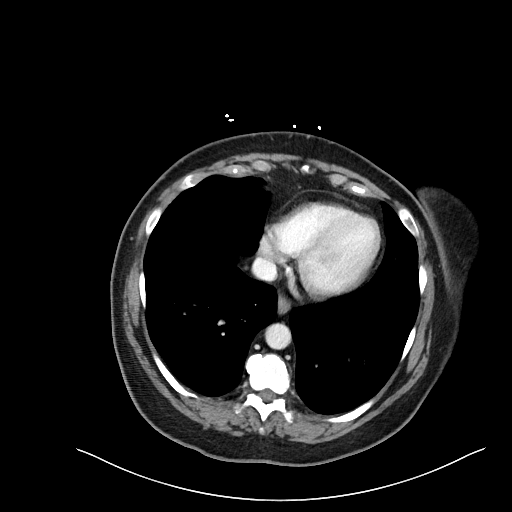

[Series 6: abdomen 3.0 mpr cor · coronal · 0.85mm/px · 3 of 118 slices shown]
[im 40/118  soft-tissue]
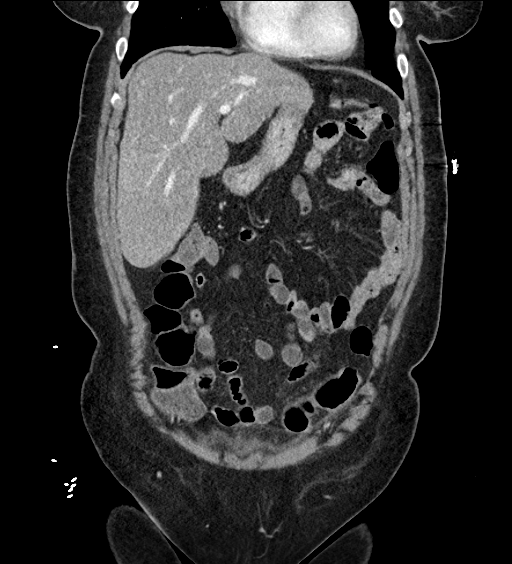
[im 53/118  soft-tissue]
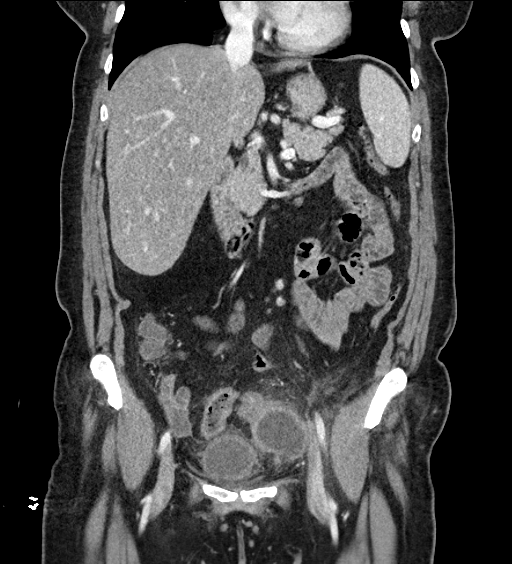
[im 66/118  soft-tissue]
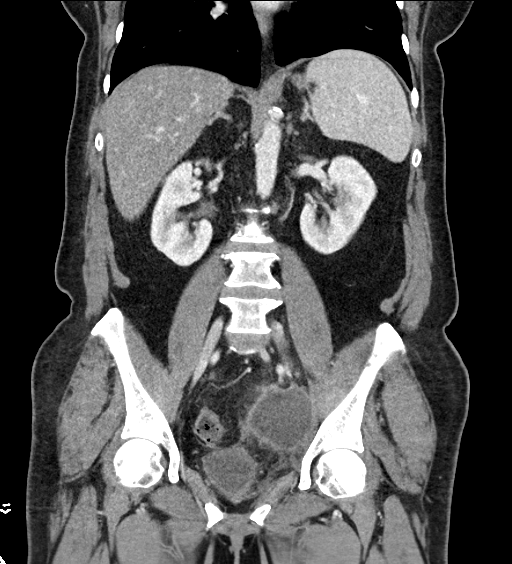

[16 of 46 positions shown; findings below may reference images not displayed]

FINDINGS: Lower chest: Lung bases show no acute consolidation or effusion.
Normal heart size

Hepatobiliary: 2.3 cm cyst within the anterior left hepatic lobe.
Status post cholecystectomy.

Pancreas: Unremarkable. No pancreatic ductal dilatation or
surrounding inflammatory changes.

Spleen: Normal in size without focal abnormality.

Adrenals/Urinary Tract: Adrenal glands are unremarkable. Kidneys are
normal, without renal calculi, focal lesion, or hydronephrosis.
Bladder is slightly thick walled with mild surrounding inflammatory
changes.

Stomach/Bowel: The stomach is unremarkable. No dilated small bowel.
Mild segmental thickening involving the sigmoid colon.

Vascular/Lymphatic: Nonaneurysmal aorta. Mild aortic
atherosclerosis. No significant adenopathy.

Reproductive: Status post hysterectomy.

Other: No free air. No significant free fluid. Generalized
inflammation within the pelvis. Rim enhancing fluid collection
within the left pelvis measuring 6.3 x 5.8 cm, abutting the slightly
thickened sigmoid colon.

Musculoskeletal: No acute or suspicious abnormality
IMPRESSION: 1. 6.3 x 5.8 cm mildly rim enhancing fluid collection within the
left pelvis, suspect for infected or inflammatory fluid collection.
This abuts the sigmoid colon which appears mildly thickened.
Findings could be secondary to contained perforation and subsequent
abscess formation, possibly due to diverticulitis although not much
diverticular changes are seen in the region. No definitive findings
for a colon mass. Alternatively, could consider inflammatory or
infected adnexal mass.
2. Generalized edema and inflammation within the pelvis.
3. Slightly thick-walled appearance of the bladder with adjacent
edema, which may reflect cystitis or reactive changes from the left
pelvic rim enhancing collection.

## 2020-12-16 DIAGNOSIS — C541 Malignant neoplasm of endometrium: Secondary | ICD-10-CM | POA: Diagnosis not present

## 2020-12-23 DIAGNOSIS — M109 Gout, unspecified: Secondary | ICD-10-CM | POA: Diagnosis not present

## 2020-12-23 DIAGNOSIS — I700 Atherosclerosis of aorta: Secondary | ICD-10-CM | POA: Diagnosis not present

## 2020-12-23 DIAGNOSIS — D179 Benign lipomatous neoplasm, unspecified: Secondary | ICD-10-CM | POA: Diagnosis not present

## 2020-12-23 DIAGNOSIS — E669 Obesity, unspecified: Secondary | ICD-10-CM | POA: Diagnosis not present

## 2020-12-23 DIAGNOSIS — E782 Mixed hyperlipidemia: Secondary | ICD-10-CM | POA: Diagnosis not present

## 2020-12-23 DIAGNOSIS — F418 Other specified anxiety disorders: Secondary | ICD-10-CM | POA: Diagnosis not present

## 2020-12-23 DIAGNOSIS — I10 Essential (primary) hypertension: Secondary | ICD-10-CM | POA: Diagnosis not present

## 2020-12-23 DIAGNOSIS — K5730 Diverticulosis of large intestine without perforation or abscess without bleeding: Secondary | ICD-10-CM | POA: Diagnosis not present

## 2020-12-23 DIAGNOSIS — R739 Hyperglycemia, unspecified: Secondary | ICD-10-CM | POA: Diagnosis not present

## 2021-05-24 DIAGNOSIS — K5730 Diverticulosis of large intestine without perforation or abscess without bleeding: Secondary | ICD-10-CM | POA: Diagnosis not present

## 2021-05-24 DIAGNOSIS — F418 Other specified anxiety disorders: Secondary | ICD-10-CM | POA: Diagnosis not present

## 2021-05-24 DIAGNOSIS — R739 Hyperglycemia, unspecified: Secondary | ICD-10-CM | POA: Diagnosis not present

## 2021-05-24 DIAGNOSIS — I10 Essential (primary) hypertension: Secondary | ICD-10-CM | POA: Diagnosis not present

## 2021-05-24 DIAGNOSIS — E782 Mixed hyperlipidemia: Secondary | ICD-10-CM | POA: Diagnosis not present

## 2021-05-24 DIAGNOSIS — I700 Atherosclerosis of aorta: Secondary | ICD-10-CM | POA: Diagnosis not present

## 2021-05-24 DIAGNOSIS — E876 Hypokalemia: Secondary | ICD-10-CM | POA: Diagnosis not present

## 2021-05-24 DIAGNOSIS — E669 Obesity, unspecified: Secondary | ICD-10-CM | POA: Diagnosis not present

## 2021-05-24 DIAGNOSIS — M109 Gout, unspecified: Secondary | ICD-10-CM | POA: Diagnosis not present

## 2021-11-23 DIAGNOSIS — R739 Hyperglycemia, unspecified: Secondary | ICD-10-CM | POA: Diagnosis not present

## 2021-11-23 DIAGNOSIS — M109 Gout, unspecified: Secondary | ICD-10-CM | POA: Diagnosis not present

## 2021-11-23 DIAGNOSIS — I10 Essential (primary) hypertension: Secondary | ICD-10-CM | POA: Diagnosis not present

## 2021-11-23 DIAGNOSIS — E782 Mixed hyperlipidemia: Secondary | ICD-10-CM | POA: Diagnosis not present

## 2021-11-23 DIAGNOSIS — D649 Anemia, unspecified: Secondary | ICD-10-CM | POA: Diagnosis not present

## 2021-11-24 DIAGNOSIS — E782 Mixed hyperlipidemia: Secondary | ICD-10-CM | POA: Diagnosis not present

## 2021-11-24 DIAGNOSIS — I10 Essential (primary) hypertension: Secondary | ICD-10-CM | POA: Diagnosis not present

## 2021-12-01 DIAGNOSIS — E782 Mixed hyperlipidemia: Secondary | ICD-10-CM | POA: Diagnosis not present

## 2021-12-01 DIAGNOSIS — N8502 Endometrial intraepithelial neoplasia [EIN]: Secondary | ICD-10-CM | POA: Diagnosis not present

## 2021-12-01 DIAGNOSIS — R82998 Other abnormal findings in urine: Secondary | ICD-10-CM | POA: Diagnosis not present

## 2021-12-01 DIAGNOSIS — E669 Obesity, unspecified: Secondary | ICD-10-CM | POA: Diagnosis not present

## 2021-12-01 DIAGNOSIS — I700 Atherosclerosis of aorta: Secondary | ICD-10-CM | POA: Diagnosis not present

## 2021-12-01 DIAGNOSIS — I10 Essential (primary) hypertension: Secondary | ICD-10-CM | POA: Diagnosis not present

## 2021-12-01 DIAGNOSIS — M109 Gout, unspecified: Secondary | ICD-10-CM | POA: Diagnosis not present

## 2021-12-01 DIAGNOSIS — Z0000 Encounter for general adult medical examination without abnormal findings: Secondary | ICD-10-CM | POA: Diagnosis not present

## 2021-12-01 DIAGNOSIS — R739 Hyperglycemia, unspecified: Secondary | ICD-10-CM | POA: Diagnosis not present

## 2021-12-01 DIAGNOSIS — F418 Other specified anxiety disorders: Secondary | ICD-10-CM | POA: Diagnosis not present

## 2022-01-12 DIAGNOSIS — C541 Malignant neoplasm of endometrium: Secondary | ICD-10-CM | POA: Diagnosis not present

## 2022-04-26 DIAGNOSIS — R399 Unspecified symptoms and signs involving the genitourinary system: Secondary | ICD-10-CM | POA: Diagnosis not present

## 2022-04-26 DIAGNOSIS — N390 Urinary tract infection, site not specified: Secondary | ICD-10-CM | POA: Diagnosis not present

## 2022-04-26 DIAGNOSIS — R350 Frequency of micturition: Secondary | ICD-10-CM | POA: Diagnosis not present

## 2022-06-02 DIAGNOSIS — D649 Anemia, unspecified: Secondary | ICD-10-CM | POA: Diagnosis not present

## 2022-06-02 DIAGNOSIS — F418 Other specified anxiety disorders: Secondary | ICD-10-CM | POA: Diagnosis not present

## 2022-06-02 DIAGNOSIS — I10 Essential (primary) hypertension: Secondary | ICD-10-CM | POA: Diagnosis not present

## 2022-06-02 DIAGNOSIS — E782 Mixed hyperlipidemia: Secondary | ICD-10-CM | POA: Diagnosis not present

## 2022-06-02 DIAGNOSIS — E669 Obesity, unspecified: Secondary | ICD-10-CM | POA: Diagnosis not present

## 2022-06-02 DIAGNOSIS — E876 Hypokalemia: Secondary | ICD-10-CM | POA: Diagnosis not present

## 2022-06-02 DIAGNOSIS — R739 Hyperglycemia, unspecified: Secondary | ICD-10-CM | POA: Diagnosis not present

## 2022-06-02 DIAGNOSIS — I700 Atherosclerosis of aorta: Secondary | ICD-10-CM | POA: Diagnosis not present

## 2022-06-02 DIAGNOSIS — M109 Gout, unspecified: Secondary | ICD-10-CM | POA: Diagnosis not present

## 2022-08-25 ENCOUNTER — Encounter (HOSPITAL_BASED_OUTPATIENT_CLINIC_OR_DEPARTMENT_OTHER): Payer: Self-pay | Admitting: *Deleted

## 2022-08-25 ENCOUNTER — Other Ambulatory Visit: Payer: Self-pay

## 2022-08-25 DIAGNOSIS — I10 Essential (primary) hypertension: Secondary | ICD-10-CM | POA: Diagnosis not present

## 2022-08-25 DIAGNOSIS — Z79899 Other long term (current) drug therapy: Secondary | ICD-10-CM | POA: Insufficient documentation

## 2022-08-25 DIAGNOSIS — N764 Abscess of vulva: Secondary | ICD-10-CM | POA: Diagnosis not present

## 2022-08-25 DIAGNOSIS — Z8542 Personal history of malignant neoplasm of other parts of uterus: Secondary | ICD-10-CM | POA: Diagnosis not present

## 2022-08-25 DIAGNOSIS — L723 Sebaceous cyst: Secondary | ICD-10-CM | POA: Diagnosis not present

## 2022-08-25 DIAGNOSIS — N907 Vulvar cyst: Secondary | ICD-10-CM | POA: Diagnosis not present

## 2022-08-25 DIAGNOSIS — Z7982 Long term (current) use of aspirin: Secondary | ICD-10-CM | POA: Diagnosis not present

## 2022-08-25 LAB — BASIC METABOLIC PANEL
Anion gap: 13 (ref 5–15)
BUN: 21 mg/dL (ref 8–23)
CO2: 27 mmol/L (ref 22–32)
Calcium: 9.5 mg/dL (ref 8.9–10.3)
Chloride: 101 mmol/L (ref 98–111)
Creatinine, Ser: 1.04 mg/dL — ABNORMAL HIGH (ref 0.44–1.00)
GFR, Estimated: 56 mL/min — ABNORMAL LOW (ref >60)
Glucose, Bld: 118 mg/dL — ABNORMAL HIGH (ref 70–99)
Potassium: 3.3 mmol/L — ABNORMAL LOW (ref 3.5–5.1)
Sodium: 141 mmol/L (ref 135–145)

## 2022-08-25 LAB — CBC WITH DIFFERENTIAL/PLATELET
Abs Immature Granulocytes: 0.06 10*3/uL (ref 0.00–0.07)
Basophils Absolute: 0.1 10*3/uL (ref 0.0–0.1)
Basophils Relative: 1 %
Eosinophils Absolute: 0.2 10*3/uL (ref 0.0–0.5)
Eosinophils Relative: 2 %
HCT: 38.5 % (ref 36.0–46.0)
Hemoglobin: 12.7 g/dL (ref 12.0–15.0)
Immature Granulocytes: 1 %
Lymphocytes Relative: 17 %
Lymphs Abs: 1.6 10*3/uL (ref 0.7–4.0)
MCH: 29.3 pg (ref 26.0–34.0)
MCHC: 33 g/dL (ref 30.0–36.0)
MCV: 88.7 fL (ref 80.0–100.0)
Monocytes Absolute: 0.8 10*3/uL (ref 0.1–1.0)
Monocytes Relative: 8 %
Neutro Abs: 6.8 10*3/uL (ref 1.7–7.7)
Neutrophils Relative %: 71 %
Platelets: 297 10*3/uL (ref 150–400)
RBC: 4.34 MIL/uL (ref 3.87–5.11)
RDW: 14.6 % (ref 11.5–15.5)
WBC: 9.5 10*3/uL (ref 4.0–10.5)
nRBC: 0 % (ref 0.0–0.2)

## 2022-08-25 NOTE — ED Triage Notes (Signed)
Pt is here for abscess on left labia, it has been draining

## 2022-08-26 ENCOUNTER — Emergency Department (HOSPITAL_BASED_OUTPATIENT_CLINIC_OR_DEPARTMENT_OTHER)
Admission: EM | Admit: 2022-08-26 | Discharge: 2022-08-26 | Disposition: A | Discharge status: Home / Self Care | Payer: Medicare HMO | Attending: Emergency Medicine | Admitting: Emergency Medicine

## 2022-08-26 DIAGNOSIS — L723 Sebaceous cyst: Secondary | ICD-10-CM | POA: Diagnosis not present

## 2022-08-26 MED ORDER — LIDOCAINE-EPINEPHRINE (PF) 2 %-1:200000 IJ SOLN
10.0000 mL | Freq: Once | INTRAMUSCULAR | Status: AC
  Administered 2022-08-26: 10 mL via INTRADERMAL
  Filled 2022-08-26: qty 20

## 2022-08-26 MED ORDER — LIDOCAINE-EPINEPHRINE-TETRACAINE (LET) TOPICAL GEL
3.0000 mL | Freq: Once | TOPICAL | Status: AC
  Administered 2022-08-26: 3 mL via TOPICAL
  Filled 2022-08-26: qty 3

## 2022-08-26 NOTE — ED Provider Notes (Signed)
[facility] EMERGENCY DEPT Provider Note  CSN: [id number] Arrival date & time: 08/25/22 2028  Chief Complaint(s) Abscess  HPI Mariah Wright is a 76 y.o. female     Abscess Location:  Pelvis Pelvic abscess location:  Vulva Size:  1.5 cm Abscess quality: draining, induration, painful and redness   Red streaking: no   Duration:  2 days Pain details:    Quality:  Pressure   Severity:  Moderate   Timing:  Constant Chronicity:  New Context: not diabetes and not skin injury   Relieved by:  Nothing Worsened by:  Nothing Associated symptoms: no fever   Risk factors: no family hx of MRSA, no hx of MRSA and no prior abscess     Past Medical History Past Medical History:  Diagnosis Date   Acute diverticulitis 03/19/2019   Bilateral knee pain    Endometrial cancer ([place])    Hypertension    Lipoma    RUQ   Obesity    Patient Active Problem List   Diagnosis Date Noted   Pericolonic abscess due to diverticulitis 03/22/2019   Sepsis ([place]) 03/20/2019   Acute diverticulitis 03/19/2019   Benign essential HTN 03/19/2019   Endometrial cancer ([place]) 03/19/2019   OSA (obstructive sleep apnea) 03/19/2019   Snoring 10/09/2016   Mouth dryness 10/09/2016   Nocturia 10/09/2016   Class 2 obesity due to excess calories without serious comorbidity in adult 10/09/2016   Chronic mouth breathing 10/09/2016   Home Medication(s) Prior to Admission medications   Medication Sig Start Date End Date Taking? Authorizing Provider  aspirin 81 MG tablet Take 81 mg by mouth daily.     [provider]  Cholecalciferol (VITAMIN D-3 PO) Take 1 capsule by mouth daily with breakfast.    [provider]  Docusate Calcium (STOOL SOFTENER PO) Take 1 capsule by mouth daily with breakfast.    [provider]  Glucosamine HCl (GLUCOSAMINE PO) Take 2 tablets by mouth daily.    [provider]  lisinopril-hydrochlorothiazide (ZESTORETIC) 20-25 MG tablet Take 1  tablet by mouth daily.    [provider]  Multiple Vitamins-Minerals (MULTI FOR HER PO) Take 1 tablet by mouth daily.    [provider]  vitamin C (ASCORBIC ACID) 500 MG tablet Take 1,000 mg by mouth daily.    [provider]                                                                                                                                    Allergies Patient has no known allergies.  Review of Systems Review of Systems  Constitutional:  Negative for fever.   As noted in HPI  Physical Exam Vital Signs  I have reviewed the triage vital signs BP (!) 118/99 (BP Location: Right Arm)   Pulse 69   Temp 98 F (36.7 C) (Oral)   Resp 20   SpO2 98%   Physical  Exam Vitals reviewed. Exam conducted with a chaperone present.  Constitutional:      General: She is not in acute distress.    Appearance: She is well-developed. She is not diaphoretic.  HENT:     Head: Normocephalic and atraumatic.     Right Ear: External ear normal.     Left Ear: External ear normal.     Nose: Nose normal.  Eyes:     General: No scleral icterus.    Conjunctiva/sclera: Conjunctivae normal.  Neck:     Trachea: Phonation normal.  Cardiovascular:     Rate and Rhythm: Normal rate and regular rhythm.  Pulmonary:     Effort: Pulmonary effort is normal. No respiratory distress.     Breath sounds: No stridor.  Abdominal:     General: There is no distension.  Genitourinary:   Musculoskeletal:        General: Normal range of motion.     Cervical back: Normal range of motion.  Neurological:     Mental Status: She is alert and oriented to person, place, and time.  Psychiatric:        Behavior: Behavior normal.     ED Results and Treatments Labs (all labs ordered are listed, but only abnormal results are displayed) Labs Reviewed  BASIC METABOLIC PANEL - Abnormal; Notable for the following components:      Result Value   Potassium 3.3 (*)    Glucose, Bld 118 (*)     Creatinine, Ser 1.04 (*)    GFR, Estimated 56 (*)    All other components within normal limits  CBC WITH DIFFERENTIAL/PLATELET                                                                                                                         EKG  EKG Interpretation  Date/Time:    Ventricular Rate:    PR Interval:    QRS Duration:   QT Interval:    QTC Calculation:   R Axis:     Text Interpretation:         Radiology No results found.  Medications Ordered in ED Medications  lidocaine-EPINEPHrine (XYLOCAINE W/EPI) 2 %-1:200000 (PF) injection 10 mL (10 mLs Intradermal Given 08/26/22 0109)  lidocaine-EPINEPHrine-tetracaine (LET) topical gel (3 mLs Topical Given 08/26/22 0105)  Procedures .Marland KitchenIncision and Drainage  Date/Time: 08/26/2022 3:03 AM  Performed by: Fatima Blank, MD Authorized by: Fatima Blank, MD   Consent:    Consent obtained:  Verbal   Consent given by:  Patient   Risks discussed:  Incomplete drainage and infection   Alternatives discussed:  Delayed treatment Universal protocol:    Procedure explained and questions answered to patient or proxy's satisfaction: yes     Patient identity confirmed:  Verbally with patient and arm band Location:    Type:  Cyst   Size:  1.5cm   Location:  Anogenital   Anogenital location:  Vulva Pre-procedure details:    Skin preparation:  Povidone-iodine Anesthesia:    Anesthesia method:  Topical application and local infiltration   Topical anesthetic:  LET   Local anesthetic:  Lidocaine 2% WITH epi Procedure type:    Complexity:  Simple Procedure details:    Incision types:  Stab incision   Incision depth:  Subcutaneous   Wound management:  Probed and deloculated   Drainage characteristics: sebaceous content.   Drainage amount:  Moderate   Wound treatment:  Wound  left open   Packing materials:  None Post-procedure details:    Procedure completion:  Tolerated   (including critical care time)  Medical Decision Making / ED Course   Medical Decision Making Amount and/or Complexity of Data Reviewed Labs: ordered. Decision-making details documented in ED Course.  Risk Prescription drug management.    Patient presents with draining lump from her left labia. Abscess versus sebaceous cyst. After I&D it appears to be this is an inflamed sebaceous cyst.  Capsule is already ruptured. No signs of superimposed infection. Recommended follow-up with surgery for excision when appropriate.      Final Clinical Impression(s) / ED Diagnoses Final diagnoses:  Sebaceous cyst   The patient appears reasonably screened and/or stabilized for discharge and I doubt any other medical condition or other [facility] requiring further screening, evaluation, or treatment in the ED at this time. I have discussed the findings, Dx and Tx plan with the patient/family who expressed understanding and agree(s) with the plan. Discharge instructions discussed at length. The patient/family was given strict return precautions who verbalized understanding of the instructions. No further questions at time of discharge.  Disposition: Discharge  Condition: Good  ED Discharge Orders     None      Follow Up: Shon Baton, MD [address] [place] [address] [phone]  Call  as needed  Ralene Ok, [place] [address] [phone]  Call             This chart was dictated using voice recognition software.  Despite best efforts to proofread,  errors can occur which can change the documentation meaning.    Fatima Blank, MD 08/26/22 [phone]

## 2022-08-26 NOTE — ED Notes (Signed)
Pt agreeable with d/c plan as discussed by provider- this nurse has verbally reinforced d/c instructions and provided pt with written copy - pt acknowledges verbal understanding and denies any addl questions, concerns, needs- pt ambulatory at d/c independently with steady gait; no distress - accompanied by female neighbor

## 2022-08-30 ENCOUNTER — Telehealth: Payer: Self-pay

## 2022-08-30 NOTE — Telephone Encounter (Signed)
     Patient  visit on 9/30  at [facility]  Have you been able to follow up with your primary care physician? yes The patient was or was not able to obtain any needed medicine or equipment. yes  Are there diet recommendations that you are having difficulty following? na  Patient expresses understanding of discharge instructions and education provided has no other needs at this time. yes     [place], Care Management  [phone] [address], [place], [place] [address] Phone: [phone] Email: [email or web address]

## 2022-09-07 DIAGNOSIS — N764 Abscess of vulva: Secondary | ICD-10-CM | POA: Diagnosis not present

## 2022-12-12 DIAGNOSIS — R739 Hyperglycemia, unspecified: Secondary | ICD-10-CM | POA: Diagnosis not present

## 2022-12-12 DIAGNOSIS — D649 Anemia, unspecified: Secondary | ICD-10-CM | POA: Diagnosis not present

## 2022-12-12 DIAGNOSIS — I10 Essential (primary) hypertension: Secondary | ICD-10-CM | POA: Diagnosis not present

## 2022-12-12 DIAGNOSIS — R7989 Other specified abnormal findings of blood chemistry: Secondary | ICD-10-CM | POA: Diagnosis not present

## 2022-12-12 DIAGNOSIS — E782 Mixed hyperlipidemia: Secondary | ICD-10-CM | POA: Diagnosis not present

## 2022-12-12 DIAGNOSIS — M109 Gout, unspecified: Secondary | ICD-10-CM | POA: Diagnosis not present

## 2022-12-18 DIAGNOSIS — Z1212 Encounter for screening for malignant neoplasm of rectum: Secondary | ICD-10-CM | POA: Diagnosis not present

## 2022-12-19 DIAGNOSIS — I10 Essential (primary) hypertension: Secondary | ICD-10-CM | POA: Diagnosis not present

## 2022-12-19 DIAGNOSIS — D179 Benign lipomatous neoplasm, unspecified: Secondary | ICD-10-CM | POA: Diagnosis not present

## 2022-12-19 DIAGNOSIS — N8502 Endometrial intraepithelial neoplasia [EIN]: Secondary | ICD-10-CM | POA: Diagnosis not present

## 2022-12-19 DIAGNOSIS — E782 Mixed hyperlipidemia: Secondary | ICD-10-CM | POA: Diagnosis not present

## 2022-12-19 DIAGNOSIS — I700 Atherosclerosis of aorta: Secondary | ICD-10-CM | POA: Diagnosis not present

## 2022-12-19 DIAGNOSIS — Z0000 Encounter for general adult medical examination without abnormal findings: Secondary | ICD-10-CM | POA: Diagnosis not present

## 2022-12-19 DIAGNOSIS — R82998 Other abnormal findings in urine: Secondary | ICD-10-CM | POA: Diagnosis not present

## 2022-12-19 DIAGNOSIS — R739 Hyperglycemia, unspecified: Secondary | ICD-10-CM | POA: Diagnosis not present

## 2022-12-19 DIAGNOSIS — G4700 Insomnia, unspecified: Secondary | ICD-10-CM | POA: Diagnosis not present

## 2022-12-19 DIAGNOSIS — F418 Other specified anxiety disorders: Secondary | ICD-10-CM | POA: Diagnosis not present

## 2023-01-11 DIAGNOSIS — Z79899 Other long term (current) drug therapy: Secondary | ICD-10-CM | POA: Diagnosis not present

## 2023-01-11 DIAGNOSIS — C541 Malignant neoplasm of endometrium: Secondary | ICD-10-CM | POA: Diagnosis not present

## 2023-01-11 DIAGNOSIS — I10 Essential (primary) hypertension: Secondary | ICD-10-CM | POA: Diagnosis not present

## 2023-01-11 DIAGNOSIS — Z6831 Body mass index (BMI) 31.0-31.9, adult: Secondary | ICD-10-CM | POA: Diagnosis not present

## 2023-01-11 DIAGNOSIS — E669 Obesity, unspecified: Secondary | ICD-10-CM | POA: Diagnosis not present

## 2023-05-07 ENCOUNTER — Encounter (HOSPITAL_BASED_OUTPATIENT_CLINIC_OR_DEPARTMENT_OTHER): Payer: Self-pay

## 2023-05-07 ENCOUNTER — Emergency Department (HOSPITAL_BASED_OUTPATIENT_CLINIC_OR_DEPARTMENT_OTHER)
Admission: EM | Admit: 2023-05-07 | Discharge: 2023-05-07 | Disposition: A | Discharge status: Home / Self Care | Payer: Medicare HMO | Attending: Emergency Medicine | Admitting: Emergency Medicine

## 2023-05-07 ENCOUNTER — Other Ambulatory Visit: Payer: Self-pay

## 2023-05-07 DIAGNOSIS — W231XXA Caught, crushed, jammed, or pinched between stationary objects, initial encounter: Secondary | ICD-10-CM | POA: Diagnosis not present

## 2023-05-07 DIAGNOSIS — Z7982 Long term (current) use of aspirin: Secondary | ICD-10-CM | POA: Diagnosis not present

## 2023-05-07 DIAGNOSIS — S61220A Laceration with foreign body of right index finger without damage to nail, initial encounter: Secondary | ICD-10-CM | POA: Diagnosis not present

## 2023-05-07 DIAGNOSIS — S60940A Unspecified superficial injury of right index finger, initial encounter: Secondary | ICD-10-CM | POA: Diagnosis present

## 2023-05-07 DIAGNOSIS — S61210A Laceration without foreign body of right index finger without damage to nail, initial encounter: Secondary | ICD-10-CM | POA: Diagnosis not present

## 2023-05-07 DIAGNOSIS — Z23 Encounter for immunization: Secondary | ICD-10-CM | POA: Insufficient documentation

## 2023-05-07 DIAGNOSIS — Z79899 Other long term (current) drug therapy: Secondary | ICD-10-CM | POA: Insufficient documentation

## 2023-05-07 MED ORDER — TETANUS-DIPHTH-ACELL PERTUSSIS 5-2.5-18.5 LF-MCG/0.5 IM SUSY
0.5000 mL | PREFILLED_SYRINGE | Freq: Once | INTRAMUSCULAR | Status: AC
  Administered 2023-05-07: 0.5 mL via INTRAMUSCULAR
  Filled 2023-05-07: qty 0.5

## 2023-05-07 MED ORDER — LIDOCAINE-EPINEPHRINE (PF) 2 %-1:200000 IJ SOLN
10.0000 mL | Freq: Once | INTRAMUSCULAR | Status: DC
  Filled 2023-05-07: qty 20

## 2023-05-07 MED ORDER — AMOXICILLIN-POT CLAVULANATE 875-125 MG PO TABS: 1.0000 | ORAL_TABLET | Freq: Two times a day (BID) | ORAL | 0 refills | Status: AC

## 2023-05-07 MED ORDER — DOXYCYCLINE HYCLATE 100 MG PO CAPS: 100.0000 mg | ORAL_CAPSULE | Freq: Two times a day (BID) | ORAL | 0 refills | Status: AC

## 2023-05-07 NOTE — ED Triage Notes (Signed)
Patient here POV from Home.  Endorses Right Second Digit Laceration sustained 2-3 Days ago. Unsure of when but states Tetanus is UTD. Notes Redness and Swelling since. No Known fevers.   NAD noted During Triage. A&Ox4. GCS 15. Ambulatory.

## 2023-05-07 NOTE — Discharge Instructions (Addendum)
You have been seen today for your complaint of finger laceration. Your discharge medications include doxycycline and augmentin. This is an antibiotic. You should take it as prescribed. You should take it for the entire duration of the prescription. This may cause an upset stomach. This is normal. You may take this with food. You may also eat yogurt to prevent diarrhea. Follow up with: Dr. Melvyn Novas. He is a Hydrographic surveyor. Call to schedule an appointment for an ED follow up visit.  Please seek immediate medical care if you develop any of the following symptoms: Your pain suddenly increases and is severe. You develop severe swelling around the wound. The wound is on your hand or foot, and you cannot properly move a finger or toe. The wound is on your hand or foot, and you notice that your fingers or toes look pale or bluish. You have a red streak going away from your wound. You develop painful lumps near the wound or on skin anywhere else on your body. At this time there does not appear to be the presence of an emergent medical condition, however there is always the potential for conditions to change. Please read and follow the below instructions.  Do not take your medicine if  develop an itchy rash, swelling in your mouth or lips, or difficulty breathing; call 911 and seek immediate emergency medical attention if this occurs.  You may review your lab tests and imaging results in their entirety on your MyChart account.  Please discuss all results of fully with your primary care provider and other specialist at your follow-up visit.  Note: Portions of this text may have been transcribed using voice recognition software. Every effort was made to ensure accuracy; however, inadvertent computerized transcription errors may still be present.

## 2023-05-07 NOTE — ED Provider Notes (Signed)
[facility] EMERGENCY DEPARTMENT AT [facility] Provider Note   CSN: [id number] Arrival date & time: 05/07/23  1438     History  Chief Complaint  Patient presents with   Finger Injury    Mariah Wright is a 77 y.o. female.  Who presents to the ED for evaluation of right finger laceration.  This is to the lateral aspect of the right pointer finger.  She sustained this injury approximately 3 days ago.  She states she caught her finger on a barbed wire fence and then pulled away which caused a laceration.  She immediately cleaned the wound with soap and water and placed bandages on the area.  She then called her PCP office at the request of her friend and stated that she had some drainage from the area.  Nurse triage encouraged her to come to the ED for further evaluation.  She states this drainage is straw-colored.  She denies any purulent drainage.  She denies any significant pain to the finger.  She denies any fevers, chills, nausea, vomiting, numbness, tingling.  Does not know when her last tetanus vaccine was.  HPI     Home Medications Prior to Admission medications   Medication Sig Start Date End Date Taking? Authorizing Provider  amoxicillin-clavulanate (AUGMENTIN) 875-125 MG tablet Take 1 tablet by mouth every 12 (twelve) hours. 05/07/23  Yes Jannis Atkins, Edsel Petrin, PA-C  doxycycline (VIBRAMYCIN) 100 MG capsule Take 1 capsule (100 mg total) by mouth 2 (two) times daily for 7 days. 05/07/23 05/14/23 Yes Brianna Bennett, Edsel Petrin, PA-C  aspirin 81 MG tablet Take 81 mg by mouth daily.     [provider]  Cholecalciferol (VITAMIN D-3 PO) Take 1 capsule by mouth daily with breakfast.    [provider]  Docusate Calcium (STOOL SOFTENER PO) Take 1 capsule by mouth daily with breakfast.    [provider]  Glucosamine HCl (GLUCOSAMINE PO) Take 2 tablets by mouth daily.    [provider]  lisinopril-hydrochlorothiazide (ZESTORETIC) 20-25 MG tablet Take 1  tablet by mouth daily.    [provider]  Multiple Vitamins-Minerals (MULTI FOR HER PO) Take 1 tablet by mouth daily.    [provider]  vitamin C (ASCORBIC ACID) 500 MG tablet Take 1,000 mg by mouth daily.    [provider]      Allergies    Patient has no known allergies.    Review of Systems   Review of Systems  Skin:  Positive for wound.  All other systems reviewed and are negative.   Physical Exam Updated Vital Signs BP (!) 135/104 (BP Location: Right Arm)   Pulse 82   Temp 98.8 F (37.1 C) (Oral)   Resp 18   Ht 5\' 4"  (1.626 m)   Wt 81.6 kg   SpO2 98%   BMI 30.88 kg/m  Physical Exam Vitals and nursing note reviewed.  Constitutional:      General: She is not in acute distress.    Appearance: Normal appearance. She is normal weight. She is not ill-appearing.  HENT:     Head: Normocephalic and atraumatic.  Pulmonary:     Effort: Pulmonary effort is normal. No respiratory distress.  Abdominal:     General: Abdomen is flat.  Musculoskeletal:        General: Normal range of motion.     Cervical back: Neck supple.     Comments: Full AROM of the right index finger.  No tenderness to palpation.  Capillary  refill normal.  Sensation intact distal to the laceration.  Skin:    General: Skin is warm and dry.     Comments: Approximately 1.5 cm linear laceration to the lateral aspect of the right pointer finger.  Overlying the middle of the proximal phalange.  Keratinization present on wound edges.  Small amount of serous drainage present as well. Minimal surrounding erythema.  Neurological:     Mental Status: She is alert and oriented to person, place, and time.  Psychiatric:        Mood and Affect: Mood normal.        Behavior: Behavior normal.     ED Results / Procedures / Treatments   Labs (all labs ordered are listed, but only abnormal results are displayed) Labs Reviewed - No data to display  EKG None  Radiology No results  found.  Procedures Procedures    Medications Ordered in ED Medications  Tdap (BOOSTRIX) injection 0.5 mL (has no administration in time range)    ED Course/ Medical Decision Making/ A&P Clinical Course as of 05/07/23 1524  Mon May 07, 2023  1512 Was consulted regarding care of this patient.  She has old laceration with erythema on the volar surface of her right index finger. Erythema circumscribed.  Negative Kanavel signs x 4. Planning to put on broad-spectrum antibiotics with Augmentin and doxycycline for broad coverage will plan to follow-up with hand provider for reassessment. [CC]    Clinical Course User Index [CC] Glyn Ade, MD                             Medical Decision Making Risk Prescription drug management.  This patient presents to the ED for concern of finger laceration, this involves an extensive number of treatment options, and is a complaint that carries with it a high risk of complications and morbidity.  The differential diagnosis includes laceration, cellulitis, osteomyelitis  My initial workup includes finger splint, update tetanus  Additional history obtained from: Nursing notes from this visit.  Afebrile, hemodynamically stable.  77 year old female presents to the ED for evaluation of laceration to the right index finger.  This occurred 3 days ago.  Outside of the window for wound closure today.  Wound does appear to be healing on exam, however there is a small amount of surrounding erythema.  Will treat with broad-spectrum antibiotics and encourage patient to follow-up with hand surgery given the possibility of evolving tenosynovitis.  She was given strict return precautions.  Stable at discharge.  At this time there does not appear to be any evidence of an acute emergency medical condition and the patient appears stable for discharge with appropriate outpatient follow up. Diagnosis was discussed with patient who verbalizes understanding of care plan  and is agreeable to discharge. I have discussed return precautions with patient who verbalizes understanding. Patient encouraged to follow-up with their PCP within 1 week. All questions answered.  Patient's case discussed with Dr. Doran Durand who agrees with plan to discharge with follow-up.   Note: Portions of this report may have been transcribed using voice recognition software. Every effort was made to ensure accuracy; however, inadvertent computerized transcription errors may still be present.        Final Clinical Impression(s) / ED Diagnoses Final diagnoses:  Laceration of right index finger without damage to nail, foreign body presence unspecified, initial encounter    Rx / DC Orders ED Discharge Orders  Ordered    amoxicillin-clavulanate (AUGMENTIN) 875-125 MG tablet  Every 12 hours        05/07/23 1512    doxycycline (VIBRAMYCIN) 100 MG capsule  2 times daily        05/07/23 1512              Michelle Piper, Cordelia Poche 05/07/23 1524    Glyn Ade, MD 05/07/23 1530

## 2023-05-07 NOTE — ED Notes (Signed)
All appropriate discharge materials reviewed at length with patient. Time for questions provided. Pt has no other questions at this time and verbalizes understanding of all provided materials.  

## 2023-05-14 ENCOUNTER — Telehealth: Payer: Self-pay

## 2023-05-14 NOTE — Telephone Encounter (Signed)
Transition Care Management Unsuccessful Follow-up Telephone Call  Date of discharge and from where:  05/07/2023 [facility]  Attempts:  1st Attempt  Reason for unsuccessful TCM follow-up call:  Left voice message  Mariah Wright [place]  [facility] Population Health Community Resource Care Guide   [email or web address]  ?? [phone]   Website: [email or web address]  [email or web address]      

## 2023-05-15 ENCOUNTER — Telehealth: Payer: Self-pay

## 2023-05-15 NOTE — Telephone Encounter (Signed)
Transition Care Management Follow-up Telephone Call Date of discharge and from where: 05/07/2023 [facility] How have you been since you were released from the hospital? Patient is feeling better Any questions or concerns? No  Items Reviewed: Did the pt receive and understand the discharge instructions provided? Yes  Medications obtained and verified? Yes  Other? No  Any new allergies since your discharge? No  Dietary orders reviewed? Yes Do you have support at home? Yes   Follow up appointments reviewed:  PCP Hospital f/u appt confirmed? No  Scheduled to see  on  @ . Specialist Hospital f/u appt confirmed? No  Scheduled to see  on  @ . Are transportation arrangements needed? No  If their condition worsens, is the pt aware to call PCP or go to the Emergency Dept.? Yes Was the patient provided with contact information for the PCP's office or ED? Yes Was to pt encouraged to call back with questions or concerns? Yes  Mariah Wright [facility]  [facility] Population Health Community Resource Care Guide   [email or web address]  ?? [phone]   Website: [email or web address]  [email or web address]

## 2023-12-21 DIAGNOSIS — E782 Mixed hyperlipidemia: Secondary | ICD-10-CM | POA: Diagnosis not present

## 2023-12-21 DIAGNOSIS — R739 Hyperglycemia, unspecified: Secondary | ICD-10-CM | POA: Diagnosis not present

## 2023-12-21 DIAGNOSIS — I10 Essential (primary) hypertension: Secondary | ICD-10-CM | POA: Diagnosis not present

## 2023-12-24 DIAGNOSIS — R69 Illness, unspecified: Secondary | ICD-10-CM | POA: Diagnosis not present

## 2023-12-25 DIAGNOSIS — Z1212 Encounter for screening for malignant neoplasm of rectum: Secondary | ICD-10-CM | POA: Diagnosis not present

## 2023-12-25 DIAGNOSIS — D649 Anemia, unspecified: Secondary | ICD-10-CM | POA: Diagnosis not present

## 2023-12-26 DIAGNOSIS — E782 Mixed hyperlipidemia: Secondary | ICD-10-CM | POA: Diagnosis not present

## 2023-12-26 DIAGNOSIS — R82998 Other abnormal findings in urine: Secondary | ICD-10-CM | POA: Diagnosis not present

## 2023-12-26 DIAGNOSIS — Z0000 Encounter for general adult medical examination without abnormal findings: Secondary | ICD-10-CM | POA: Diagnosis not present

## 2023-12-26 DIAGNOSIS — F418 Other specified anxiety disorders: Secondary | ICD-10-CM | POA: Diagnosis not present

## 2023-12-26 DIAGNOSIS — M109 Gout, unspecified: Secondary | ICD-10-CM | POA: Diagnosis not present

## 2023-12-26 DIAGNOSIS — I700 Atherosclerosis of aorta: Secondary | ICD-10-CM | POA: Diagnosis not present

## 2023-12-26 DIAGNOSIS — Z23 Encounter for immunization: Secondary | ICD-10-CM | POA: Diagnosis not present

## 2023-12-26 DIAGNOSIS — E669 Obesity, unspecified: Secondary | ICD-10-CM | POA: Diagnosis not present

## 2023-12-26 DIAGNOSIS — Z1331 Encounter for screening for depression: Secondary | ICD-10-CM | POA: Diagnosis not present

## 2023-12-26 DIAGNOSIS — I10 Essential (primary) hypertension: Secondary | ICD-10-CM | POA: Diagnosis not present

## 2023-12-26 DIAGNOSIS — G4700 Insomnia, unspecified: Secondary | ICD-10-CM | POA: Diagnosis not present

## 2024-04-24 DIAGNOSIS — C541 Malignant neoplasm of endometrium: Secondary | ICD-10-CM | POA: Diagnosis not present
